# Patient Record
Sex: Male | Born: 1950 | Race: White | Hispanic: No | Marital: Married | Smoking: Never smoker
Health system: Southern US, Community
[De-identification: ages and names within clinical notes are randomized; demographics above are authoritative.]

## PROBLEM LIST (undated history)

## (undated) DIAGNOSIS — I10 Essential (primary) hypertension: Secondary | ICD-10-CM

## (undated) DIAGNOSIS — E039 Hypothyroidism, unspecified: Secondary | ICD-10-CM

## (undated) DIAGNOSIS — E119 Type 2 diabetes mellitus without complications: Secondary | ICD-10-CM

## (undated) HISTORY — PX: OTHER SURGICAL HISTORY: SHX169

## (undated) HISTORY — PX: PENILE PROSTHESIS IMPLANT: SHX240

---

## 2005-04-07 ENCOUNTER — Ambulatory Visit: Payer: Self-pay | Admitting: Cardiology

## 2005-04-10 ENCOUNTER — Ambulatory Visit: Payer: Self-pay | Admitting: Cardiology

## 2005-04-10 ENCOUNTER — Inpatient Hospital Stay (HOSPITAL_BASED_OUTPATIENT_CLINIC_OR_DEPARTMENT_OTHER): Admission: RE | Admit: 2005-04-10 | Discharge: 2005-04-10 | Payer: Self-pay | Admitting: Cardiology

## 2005-04-21 ENCOUNTER — Ambulatory Visit: Payer: Self-pay | Admitting: Cardiology

## 2005-04-21 ENCOUNTER — Ambulatory Visit: Payer: Self-pay

## 2005-04-28 ENCOUNTER — Ambulatory Visit: Payer: Self-pay

## 2005-05-02 ENCOUNTER — Ambulatory Visit: Payer: Self-pay | Admitting: Cardiology

## 2005-06-07 ENCOUNTER — Ambulatory Visit: Payer: Self-pay | Admitting: Cardiology

## 2005-08-27 IMAGING — CT CT ANGIO CHEST
1 of 2 series · 20 of 32 positions shown · IV contrast (APPLIED)
Comparison: none

CLINICAL DATA: Substernal chest pain.  Shortness of breath.  Questionable pulmonary embolus. 
 CT ANGIOGRAPHY OF CHEST (PULMONARY EMBOLUS PROTOCOL):
TECHNIQUE: Multidetector CT imaging of the chest was performed during bolus injection of intravenous contrast.  Multiplanar CT angiographic image reconstructions were generated to evaluate the vascular anatomy.
 Contrast:  125 cc Omnipaque 300, 4 cc/second. 
 There is no CT scan evidence for pulmonary emboli.  There is no evidence for aortic dissection. There is no mediastinal or hilar adenopathy.  There is a small calcified granuloma seen within the right middle lobe.    The chest wall structures as visualized have a normal appearance. There are no infiltrates.  Moderate atelectasis noted within the lung bases.

[Series 9: pe 1.0 b20f st · axial · 0.77mm/px · z∈[-305,-60]mm · 20 of 273 slices shown]
[im 14/273  lung]
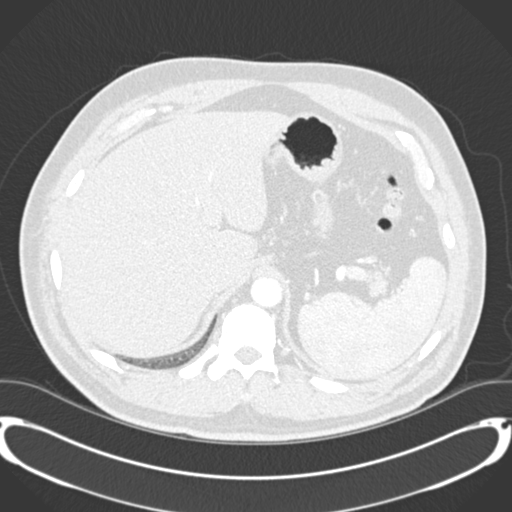
[im 28/273  mediastinal]
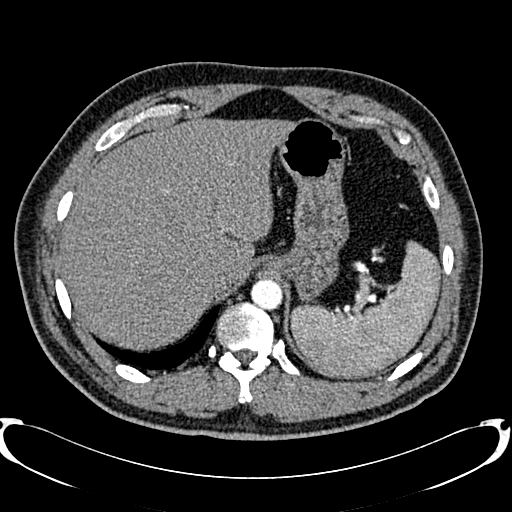
[im 41/273  lung]
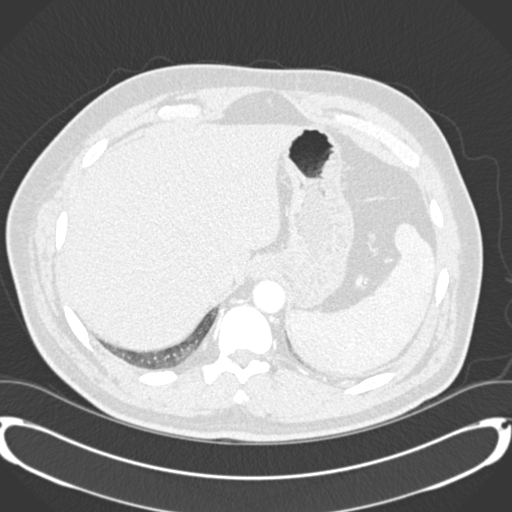
[im 55/273  mediastinal]
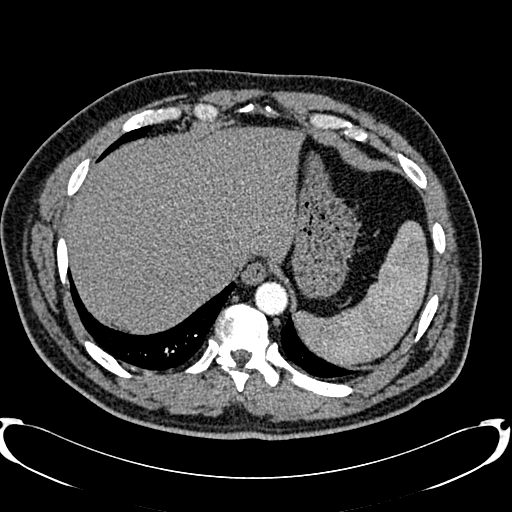
[im 69/273  lung]
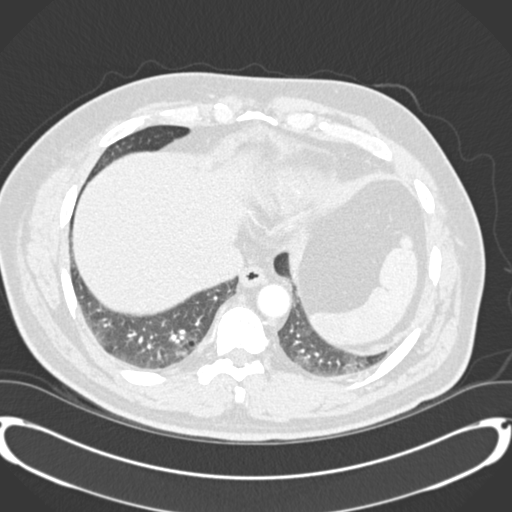
[im 91/273  mediastinal]
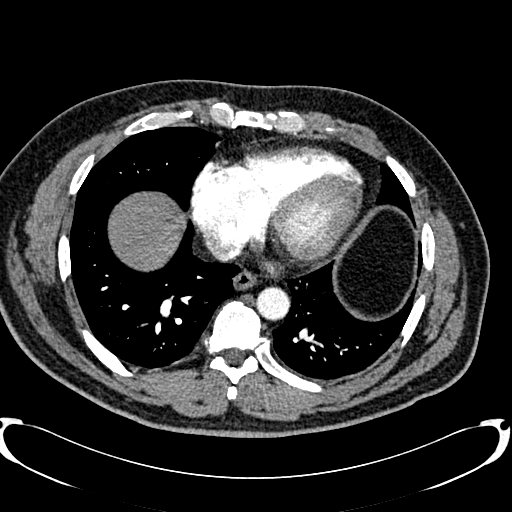
[im 96/273  lung]
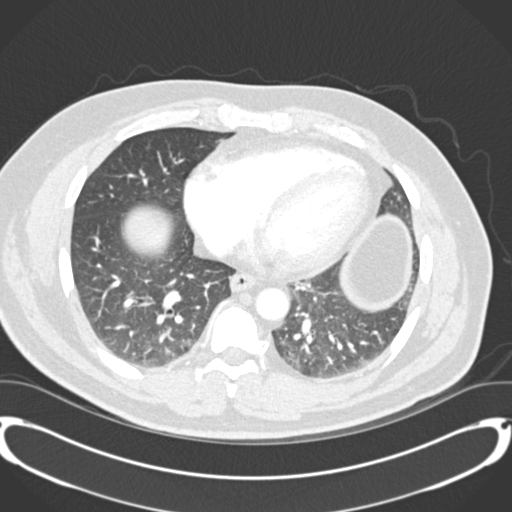
[im 109/273  mediastinal]
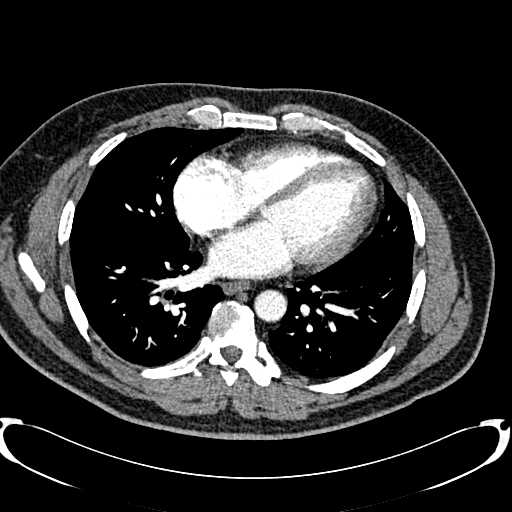
[im 120/273  lung]
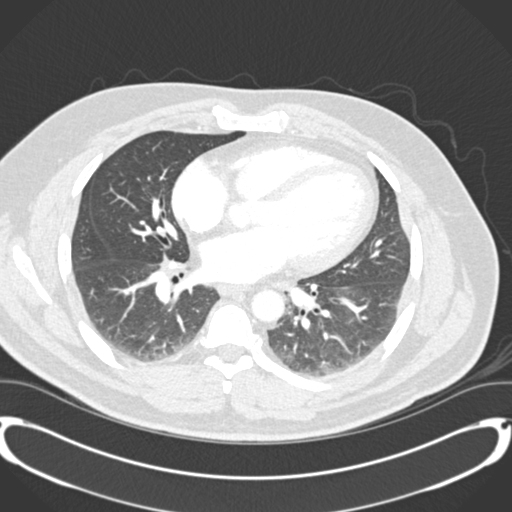
[im 123/273  mediastinal]
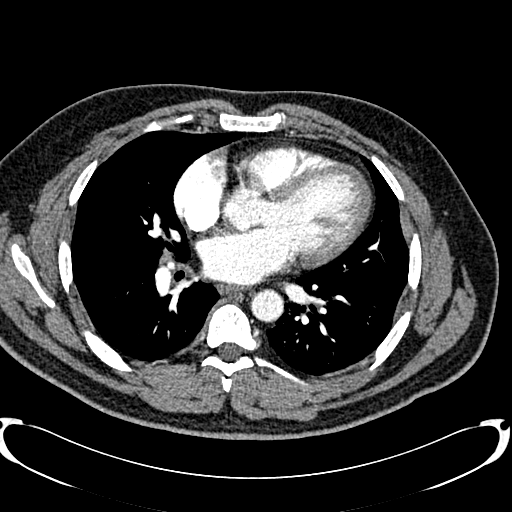
[im 137/273  lung]
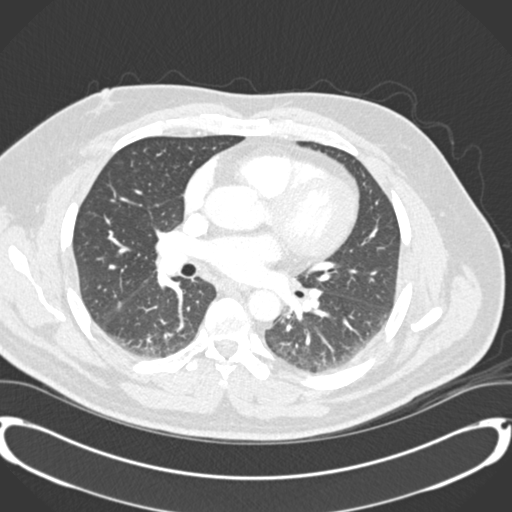
[im 150/273  mediastinal]
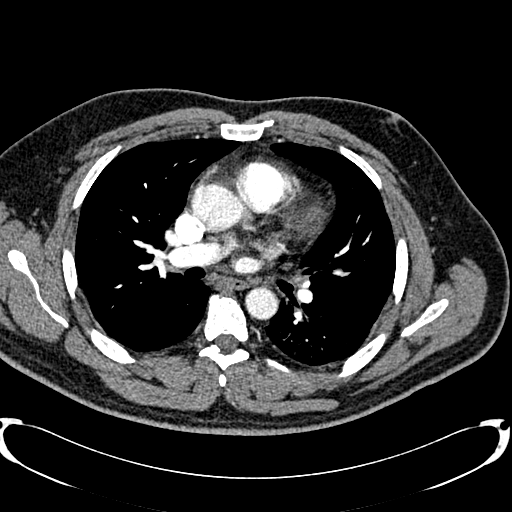
[im 164/273  lung]
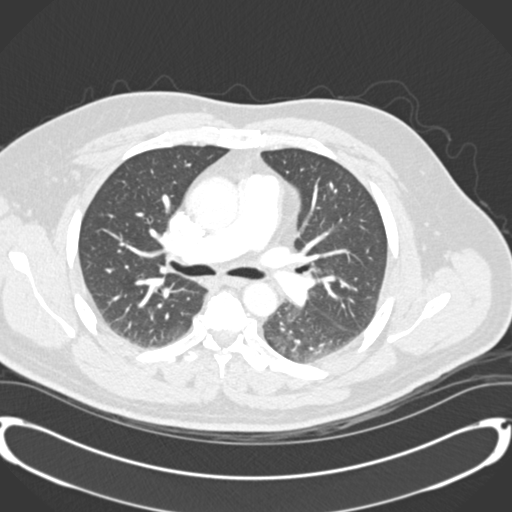
[im 177/273  mediastinal]
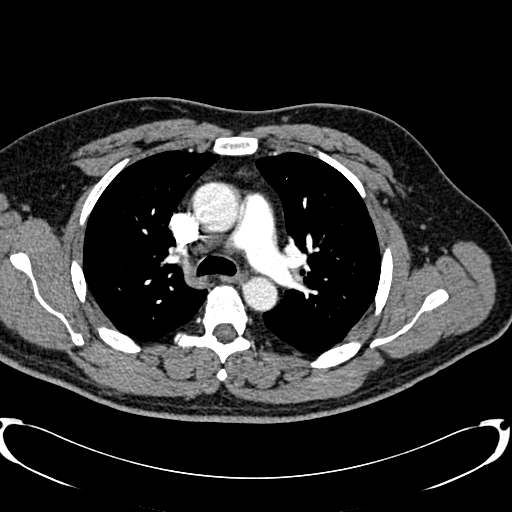
[im 182/273  lung]
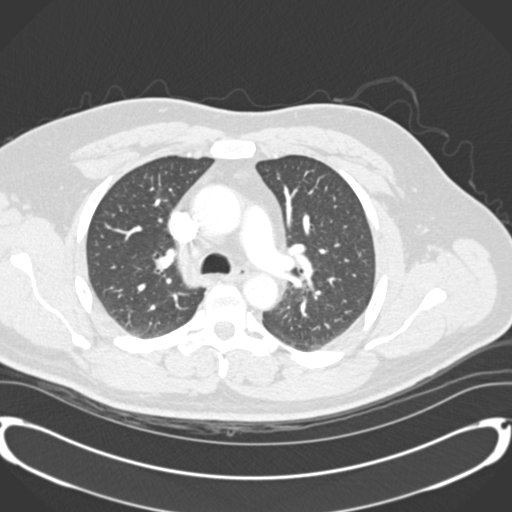
[im 205/273  mediastinal]
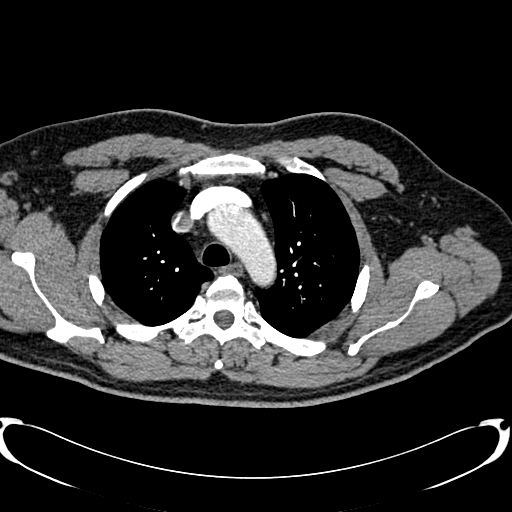
[im 218/273  lung]
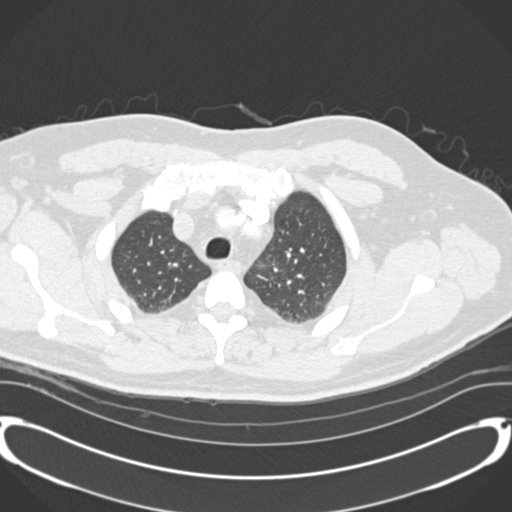
[im 232/273  mediastinal]
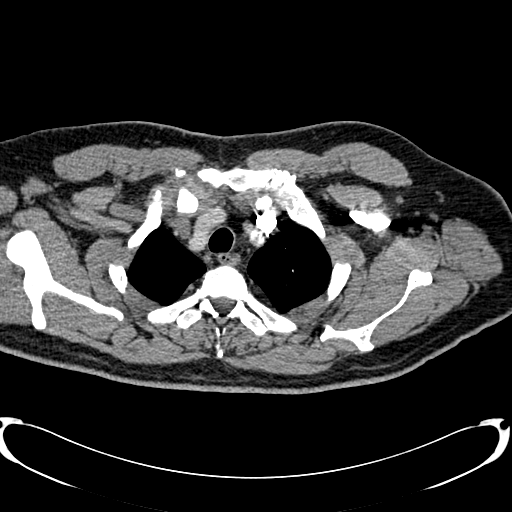
[im 245/273  lung]
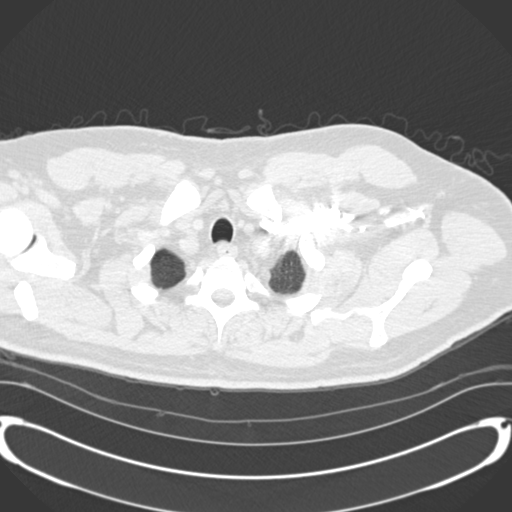
[im 259/273  mediastinal]
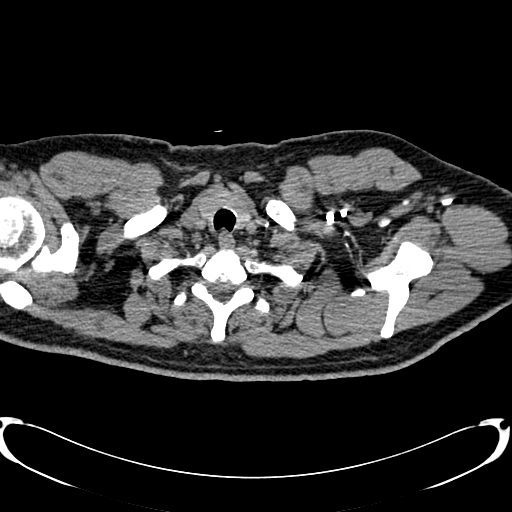

[20 of 32 positions shown; findings below may reference images not displayed]

IMPRESSION: No CT scan evidence for pulmonary emboli or aortic dissection.  Minor bibasilar atelectasis.  Evidence for previous granulomatous disease.

## 2018-07-11 ENCOUNTER — Encounter: Payer: Self-pay | Admitting: Gastroenterology

## 2018-07-12 ENCOUNTER — Encounter: Payer: Self-pay | Admitting: Gastroenterology

## 2020-06-25 ENCOUNTER — Other Ambulatory Visit: Payer: Self-pay | Admitting: Urology

## 2020-07-07 ENCOUNTER — Encounter (HOSPITAL_BASED_OUTPATIENT_CLINIC_OR_DEPARTMENT_OTHER): Payer: Self-pay | Admitting: Urology

## 2020-07-07 ENCOUNTER — Other Ambulatory Visit: Payer: Self-pay

## 2020-07-07 NOTE — Progress Notes (Addendum)
Needs cbc, bmet  ekg day of surgery

## 2020-07-07 NOTE — Progress Notes (Signed)
Spoke w/ via phone for pre-op interview--- Lab needs dos----               Lab results------ COVID test 07/08/20  Arrive at 0845AM NPO after MN NO Solid Food.  Clear liquids from MN [phone] Medications to take morning of surgery SYNTHROID  Diabetic medication NONE DAY OF AND NO SYNJARDY DAY BEFORE  Patient Special Instructions HIBICLENS SHOWER X 2  Pre-Op special Istructions ----- Patient verbalized understanding of instructions that were given at this phone interview. Patient denies shortness of breath, chest pain, fever, cough at this phone interview.

## 2020-07-08 ENCOUNTER — Other Ambulatory Visit (HOSPITAL_COMMUNITY)
Admission: RE | Admit: 2020-07-08 | Discharge: 2020-07-08 | Disposition: A | Discharge status: Home / Self Care | Payer: Medicare Other | Source: Ambulatory Visit | Attending: Urology | Admitting: Urology

## 2020-07-08 ENCOUNTER — Encounter (HOSPITAL_COMMUNITY)
Admission: RE | Admit: 2020-07-08 | Discharge: 2020-07-08 | Disposition: A | Discharge status: Home / Self Care | Payer: Medicare Other | Source: Ambulatory Visit | Attending: Urology | Admitting: Urology

## 2020-07-08 ENCOUNTER — Other Ambulatory Visit (HOSPITAL_COMMUNITY): Payer: Medicare Other

## 2020-07-08 DIAGNOSIS — Z20822 Contact with and (suspected) exposure to covid-19: Secondary | ICD-10-CM | POA: Insufficient documentation

## 2020-07-08 DIAGNOSIS — Z01818 Encounter for other preprocedural examination: Secondary | ICD-10-CM | POA: Insufficient documentation

## 2020-07-08 DIAGNOSIS — E119 Type 2 diabetes mellitus without complications: Secondary | ICD-10-CM | POA: Diagnosis not present

## 2020-07-08 LAB — BASIC METABOLIC PANEL
Anion gap: 10 (ref 5–15)
BUN: 15 mg/dL (ref 8–23)
CO2: 25 mmol/L (ref 22–32)
Calcium: 9.3 mg/dL (ref 8.9–10.3)
Chloride: 103 mmol/L (ref 98–111)
Creatinine, Ser: 0.87 mg/dL (ref 0.61–1.24)
GFR calc Af Amer: >60 mL/min (ref >60)
GFR calc non Af Amer: >60 mL/min (ref >60)
Glucose, Bld: 153 mg/dL — ABNORMAL HIGH (ref 70–99)
Potassium: 4.4 mmol/L (ref 3.5–5.1)
Sodium: 138 mmol/L (ref 135–145)

## 2020-07-08 LAB — CBC
HCT: 41.5 % (ref 39.0–52.0)
Hemoglobin: 13.5 g/dL (ref 13.0–17.0)
MCH: 29.9 pg (ref 26.0–34.0)
MCHC: 32.5 g/dL (ref 30.0–36.0)
MCV: 91.8 fL (ref 80.0–100.0)
Platelets: 226 10*3/uL (ref 150–400)
RBC: 4.52 MIL/uL (ref 4.22–5.81)
RDW: 13.3 % (ref 11.5–15.5)
WBC: 5.9 10*3/uL (ref 4.0–10.5)
nRBC: 0 % (ref 0.0–0.2)

## 2020-07-08 LAB — SARS CORONAVIRUS 2 (TAT 6-24 HRS): SARS Coronavirus 2: NEGATIVE

## 2020-07-11 ENCOUNTER — Encounter (HOSPITAL_BASED_OUTPATIENT_CLINIC_OR_DEPARTMENT_OTHER): Payer: Self-pay | Admitting: Anesthesiology

## 2020-07-11 NOTE — Anesthesia Preprocedure Evaluation (Deleted)
Anesthesia Evaluation    Reviewed: Allergy & Precautions, Patient's Chart, lab work & pertinent test results  Airway        Dental   Pulmonary neg pulmonary ROS,           Cardiovascular Exercise Tolerance: Good negative cardio ROS       Neuro/Psych negative neurological ROS  negative psych ROS   GI/Hepatic negative GI ROS, Neg liver ROS,   Endo/Other  diabetes, Type 2, Insulin DependentHypothyroidism   Renal/GU negative Renal ROSK+ 4.4 Cr 0.87     Musculoskeletal negative musculoskeletal ROS (+)   Abdominal (+) + obese,   Peds  Hematology hgb 13.5   Anesthesia Other Findings   Reproductive/Obstetrics                             Anesthesia Physical Anesthesia Plan  ASA: II  Anesthesia Plan: General   Post-op Pain Management:    Induction: Intravenous  PONV Risk Score and Plan: 3 and Treatment may vary due to age or medical condition, Ondansetron, Dexamethasone and Midazolam  Airway Management Planned: Oral ETT  Additional Equipment: None  Intra-op Plan:   Post-operative Plan:   Informed Consent:     Dental advisory given  Plan Discussed with: CRNA and Anesthesiologist  Anesthesia Plan Comments:         Anesthesia Quick Evaluation

## 2020-07-12 ENCOUNTER — Ambulatory Visit (HOSPITAL_BASED_OUTPATIENT_CLINIC_OR_DEPARTMENT_OTHER)
Admission: RE | Admit: 2020-07-12 | Discharge: 2020-07-12 | Disposition: A | Discharge status: Home / Self Care | Payer: Medicare Other | Attending: Urology | Admitting: Urology

## 2020-07-12 ENCOUNTER — Other Ambulatory Visit: Payer: Self-pay

## 2020-07-12 ENCOUNTER — Encounter (HOSPITAL_BASED_OUTPATIENT_CLINIC_OR_DEPARTMENT_OTHER): Payer: Self-pay | Admitting: Urology

## 2020-07-12 ENCOUNTER — Encounter (HOSPITAL_BASED_OUTPATIENT_CLINIC_OR_DEPARTMENT_OTHER)
Admission: RE | Disposition: A | Discharge status: Home / Self Care | Payer: Self-pay | Source: Home / Self Care | Attending: Urology

## 2020-07-12 DIAGNOSIS — T83490A Other mechanical complication of penile (implanted) prosthesis, initial encounter: Secondary | ICD-10-CM | POA: Insufficient documentation

## 2020-07-12 DIAGNOSIS — Z539 Procedure and treatment not carried out, unspecified reason: Secondary | ICD-10-CM | POA: Diagnosis not present

## 2020-07-12 DIAGNOSIS — Y831 Surgical operation with implant of artificial internal device as the cause of abnormal reaction of the patient, or of later complication, without mention of misadventure at the time of the procedure: Secondary | ICD-10-CM | POA: Insufficient documentation

## 2020-07-12 HISTORY — DX: Hypothyroidism, unspecified: E03.9

## 2020-07-12 HISTORY — DX: Type 2 diabetes mellitus without complications: E11.9

## 2020-07-12 SURGERY — REMOVAL, PENILE PROSTHESIS
Anesthesia: General

## 2020-07-12 MED ORDER — CEFAZOLIN SODIUM-DEXTROSE 2-4 GM/100ML-% IV SOLN
INTRAVENOUS | Status: AC
  Filled 2020-07-12: qty 100

## 2020-07-12 MED ORDER — MIDAZOLAM HCL 2 MG/2ML IJ SOLN
INTRAMUSCULAR | Status: AC
  Filled 2020-07-12: qty 2

## 2020-07-12 MED ORDER — VANCOMYCIN HCL IN DEXTROSE 1-5 GM/200ML-% IV SOLN: 1000.0000 mg | INTRAVENOUS | Status: DC

## 2020-07-12 MED ORDER — CEFAZOLIN SODIUM-DEXTROSE 2-4 GM/100ML-% IV SOLN: 2.0000 g | INTRAVENOUS | Status: DC

## 2020-07-12 MED ORDER — VANCOMYCIN HCL IN DEXTROSE 1-5 GM/200ML-% IV SOLN
INTRAVENOUS | Status: AC
  Filled 2020-07-12: qty 200

## 2020-07-12 MED ORDER — LACTATED RINGERS IV SOLN: INTRAVENOUS | Status: DC

## 2020-07-12 MED ORDER — DEXAMETHASONE SODIUM PHOSPHATE 10 MG/ML IJ SOLN
INTRAMUSCULAR | Status: AC
  Filled 2020-07-12: qty 1

## 2020-07-12 MED ORDER — PROPOFOL 10 MG/ML IV BOLUS
INTRAVENOUS | Status: AC
  Filled 2020-07-12: qty 20

## 2020-07-12 MED ORDER — LIDOCAINE 2% (20 MG/ML) 5 ML SYRINGE
INTRAMUSCULAR | Status: AC
  Filled 2020-07-12: qty 5

## 2020-07-12 MED ORDER — FENTANYL CITRATE (PF) 250 MCG/5ML IJ SOLN
INTRAMUSCULAR | Status: AC
  Filled 2020-07-12: qty 5

## 2020-07-12 MED ORDER — ONDANSETRON HCL 4 MG/2ML IJ SOLN
INTRAMUSCULAR | Status: AC
  Filled 2020-07-12: qty 2

## 2020-07-12 MED ORDER — GENTAMICIN SULFATE 40 MG/ML IJ SOLN
5.0000 mg/kg | INTRAVENOUS | Status: DC
  Filled 2020-07-12: qty 9

## 2020-07-12 SURGICAL SUPPLY — 49 items
BAG DECANTER FOR FLEXI CONT (MISCELLANEOUS) IMPLANT
BAG URINE DRAIN 2000ML AR STRL (UROLOGICAL SUPPLIES) IMPLANT
BLADE HEX COATED 2.75 (ELECTRODE) IMPLANT
BLADE SURG 15 STRL LF DISP TIS (BLADE) IMPLANT
BLADE SURG 15 STRL SS (BLADE)
BNDG COHESIVE 2X5 TAN STRL LF (GAUZE/BANDAGES/DRESSINGS) IMPLANT
BNDG GAUZE ELAST 4 BULKY (GAUZE/BANDAGES/DRESSINGS) IMPLANT
CATH FOLEY 2WAY SLVR  5CC 16FR (CATHETERS)
CATH FOLEY 2WAY SLVR 5CC 16FR (CATHETERS) IMPLANT
CHLORAPREP W/TINT 26 (MISCELLANEOUS) IMPLANT
COVER BACK TABLE 60X90IN (DRAPES) IMPLANT
COVER MAYO STAND STRL (DRAPES) IMPLANT
COVER WAND RF STERILE (DRAPES) IMPLANT
DERMABOND ADVANCED (GAUZE/BANDAGES/DRESSINGS)
DERMABOND ADVANCED .7 DNX12 (GAUZE/BANDAGES/DRESSINGS) IMPLANT
DRAIN CHANNEL 10F 3/8 F FF (DRAIN) IMPLANT
DRAPE INCISE IOBAN 66X45 STRL (DRAPES) IMPLANT
DRAPE LAPAROTOMY 100X72 PEDS (DRAPES) IMPLANT
EVACUATOR DRAINAGE 7X20 100CC (MISCELLANEOUS) IMPLANT
EVACUATOR SILICONE 100CC (DRAIN) IMPLANT
EVACUATOR SILICONE 100CC (MISCELLANEOUS)
GAUZE SPONGE 4X4 12PLY STRL LF (GAUZE/BANDAGES/DRESSINGS) IMPLANT
GLOVE BIO SURGEON STRL SZ7.5 (GLOVE) IMPLANT
GOWN STRL REUS W/TWL XL LVL3 (GOWN DISPOSABLE) IMPLANT
HOLDER FOLEY CATH W/STRAP (MISCELLANEOUS) IMPLANT
HOOD PEEL AWAY FLYTE STAYCOOL (MISCELLANEOUS) IMPLANT
NEEDLE HYPO 22GX1.5 SAFETY (NEEDLE) IMPLANT
NS IRRIG 1000ML POUR BTL (IV SOLUTION) IMPLANT
PACK BASIN DAY SURGERY FS (CUSTOM PROCEDURE TRAY) IMPLANT
PENCIL SMOKE EVACUATOR (MISCELLANEOUS) IMPLANT
PLUG CATH AND CAP STER (CATHETERS) IMPLANT
RETRACTOR WILSON SYSTEM (INSTRUMENTS) IMPLANT
SUPPORT SCROTAL LG STRP (MISCELLANEOUS) IMPLANT
SUPPORTER ATHLETIC LG (MISCELLANEOUS)
SUT MNCRL AB 3-0 PS2 18 (SUTURE) IMPLANT
SUT VIC AB 2-0 SH 27 (SUTURE)
SUT VIC AB 2-0 SH 27XBRD (SUTURE) IMPLANT
SUT VIC AB 2-0 UR6 27 (SUTURE) IMPLANT
SUT VIC AB 4-0 PS2 18 (SUTURE) IMPLANT
SYR 10ML LL (SYRINGE) IMPLANT
SYR 50ML LL SCALE MARK (SYRINGE) IMPLANT
SYR BULB IRRIG 60ML STRL (SYRINGE) IMPLANT
SYR CONTROL 10ML LL (SYRINGE) IMPLANT
TOWEL OR 17X26 10 PK STRL BLUE (TOWEL DISPOSABLE) IMPLANT
TRAY DSU PREP LF (CUSTOM PROCEDURE TRAY) IMPLANT
TUBE CONNECTING 12'X1/4 (SUCTIONS)
TUBE CONNECTING 12X1/4 (SUCTIONS) IMPLANT
WATER STERILE IRR 1000ML POUR (IV SOLUTION) IMPLANT
YANKAUER SUCT BULB TIP NO VENT (SUCTIONS) IMPLANT

## 2020-07-20 ENCOUNTER — Encounter: Payer: Self-pay | Admitting: Neurology

## 2020-07-27 ENCOUNTER — Other Ambulatory Visit: Payer: Self-pay | Admitting: Urology

## 2020-08-09 ENCOUNTER — Encounter (HOSPITAL_BASED_OUTPATIENT_CLINIC_OR_DEPARTMENT_OTHER): Payer: Self-pay

## 2020-08-09 ENCOUNTER — Ambulatory Visit (HOSPITAL_BASED_OUTPATIENT_CLINIC_OR_DEPARTMENT_OTHER): Admit: 2020-08-09 | Payer: Medicare Other | Admitting: Urology

## 2020-08-09 SURGERY — REMOVAL, PENILE PROSTHESIS
Anesthesia: General

## 2020-09-13 ENCOUNTER — Ambulatory Visit (HOSPITAL_BASED_OUTPATIENT_CLINIC_OR_DEPARTMENT_OTHER): Admit: 2020-09-13 | Payer: Medicare Other | Admitting: Urology

## 2020-09-13 ENCOUNTER — Encounter (HOSPITAL_BASED_OUTPATIENT_CLINIC_OR_DEPARTMENT_OTHER): Payer: Self-pay

## 2020-09-13 SURGERY — REMOVAL, PENILE PROSTHESIS
Anesthesia: General

## 2020-10-20 NOTE — Progress Notes (Unsigned)
[facility] Clinic Note - Initial Visit   Date: 10/21/20  Adam Schwartz MRN: [id number] DOB: 01-11-1951   Dear Dr. Karna Christmas:  Thank you for your kind referral of Adam Schwartz for consultation of bilateral hand numbness. Although his history is well known to you, please allow [place] to reiterate it for the purpose of our medical record. The patient was accompanied to the clinic by self.    History of Present Illness: Adam Schwartz is a 70 y.o. right-handed male with diabetes mellitus (HbA1c 7.3), hypertension, hyperlipidemia, and hypothyroidism presenting for evaluation of bilateral hand numbness.   Starting around the fall of 2021, he began having tingling and numbness over the fingertips.  Symptoms are constant and worse in the morning.  He has some difficulty opening jars or bottles with the right hand, but attributes this to difficulty making a tight fist because his right middle finger does not flex completely.  He has episodes where he was unable to completely extend the middle finger and manually having to pry it open. No neck pain. He denies numbness/tingling in the feet.    His diabetes is well-controlled. No history of heavy alcohol use. He owns a bar.   Past Medical History:  Diagnosis Date  . Diabetes mellitus without complication (HCC)    TYPE 2   . Hypothyroidism     Past Surgical History:  Procedure Laterality Date  . MOLE REMOVED     . PENILE PROSTHESIS IMPLANT    . RIGHT BROKEN ANKLE SURGERY        Medications:  Outpatient Encounter Medications as of 10/21/2020  Medication Sig  . acyclovir (ZOVIRAX) 400 MG tablet Take 400 mg by mouth daily. 1/2 TABLET DAILY  . Ascorbic Acid (VITAMIN C) 1000 MG tablet Take 1,000 mg by mouth daily.  . Cholecalciferol (VITAMIN D3) 75 MCG (3000 UT) TABS Take by mouth. PT TAKES 50MCG-200iU - PT TAKES ONCE DAILY  . Cyanocobalamin (VITAMIN B 12 PO) Take by mouth daily. - ONCE DAILY  . diclofenac Sodium  (VOLTAREN) 1 % GEL Apply topically.  . docusate sodium (COLACE) 50 MG capsule Take 50 mg by mouth daily.  . Empagliflozin-metFORMIN HCl (SYNJARDY) 12.02-999 MG TABS Take by mouth daily. PT TAKES 2 TABLETS IN THE AM  . levothyroxine (SYNTHROID) 50 MCG tablet Take 50 mcg by mouth daily before breakfast.  . lisinopril (ZESTRIL) 5 MG tablet Take 5 mg by mouth daily.  . meloxicam (MOBIC) 15 MG tablet Take 15 mg by mouth daily.  . pioglitazone (ACTOS) 45 MG tablet Take 45 mg by mouth daily.  . Semaglutide (OZEMPIC, 1 MG/DOSE, [place]) Inject into the skin once a week. TAKES ON tUESDAYS  . Sennosides (HCA LAX-X PO) Take by mouth. AS NEEDED  . simvastatin (ZOCOR) 40 MG tablet Take 40 mg by mouth daily.  Marland Kitchen tiZANidine (ZANAFLEX) 4 MG capsule Take 4 mg by mouth as needed for muscle spasms.  . diphenhydrAMINE (BENADRYL) 25 MG tablet Take 25 mg by mouth every 6 (six) hours as needed. AS NEEDED FOR ALLERGIES (Patient not taking: Reported on 10/21/2020)  . diphenhydrAMINE (BENADRYL) 50 MG tablet Take 50 mg by mouth at bedtime as needed for itching. (Patient not taking: Reported on 10/21/2020)   No facility-administered encounter medications on file as of 10/21/2020.    Allergies: No Known Allergies  Family History: Family History  Problem Relation Age of Onset  . Anxiety disorder Mother   . Heart attack Father 76  . Lung cancer Brother  Social History: Social History   Tobacco Use  . Smoking status: Never Smoker  . Smokeless tobacco: Never Used  Vaping Use  . Vaping Use: Never used  Substance Use Topics  . Alcohol use: Yes    Comment: ONCE PER WEEK- 2-3 BEERS   . Drug use: Never   Social History   Social History Narrative   Right handed   Drinks caffeine   One story home    Vital Signs:  BP (!) 145/71   Pulse 74   Resp 18   Ht 5\' 6"  (1.676 m)   Wt 191 lb (86.6 kg)   SpO2 98%   BMI 30.83 kg/m    Neurological Exam: MENTAL STATUS including orientation to time, place, person,  recent and remote memory, attention span and concentration, language, and fund of knowledge is normal.  Speech is not dysarthric.  CRANIAL NERVES: II:  No visual field defects.  III-IV-VI: Pupils equal round and reactive to light.  Normal conjugate, extra-ocular eye movements in all directions of gaze.  No nystagmus.  No ptosis.   V:  Normal facial sensation.    VII:  Normal facial symmetry and movements.   VIII:  Normal hearing and vestibular function.   IX-X:  Normal palatal movement.   XI:  Normal shoulder shrug and head rotation.   XII:  Normal tongue strength and range of motion, no deviation or fasciculation.  MOTOR:  Incomplete right middle finger flexion at the PIP and MCP.  No atrophy, fasciculations or abnormal movements.  No pronator drift.   Upper Extremity:  Right  Left  Deltoid  5/5   5/5   Biceps  5/5   5/5   Triceps  5/5   5/5   Infraspinatus 5/5  5/5  Medial pectoralis 5/5  5/5  Wrist extensors  5/5   5/5   Wrist flexors  5/5   5/5   Finger extensors  5/5   5/5   Finger flexors  5/5   5/5   Dorsal interossei  5-/5   5/5   Abductor pollicis  5-/5   5-/5   Tone (Ashworth scale)  0  0   Lower Extremity:  Right  Left  Hip flexors  5/5   5/5   Hip extensors  5/5   5/5   Adductor 5/5  5/5  Abductor 5/5  5/5  Knee flexors  5/5   5/5   Knee extensors  5/5   5/5   Dorsiflexors  5/5   5/5   Plantarflexors  5/5   5/5   Toe extensors  5/5   5/5   Toe flexors  5/5   5/5   Tone (Ashworth scale)  0  0   MSRs:  Right        Left                  brachioradialis 2+  2+  biceps 2+  2+  triceps 2+  2+  patellar 2+  2+  ankle jerk 2+  2+  Hoffman no  no  plantar response down  down   SENSORY:  Normal and symmetric perception of light touch, pinprick, vibration, and proprioception.   COORDINATION/GAIT: Normal finger-to- nose-finger and heel-to-shin.  Intact rapid alternating movements bilaterally.  Gait narrow based and stable. Tandem and stressed gait intact.     IMPRESSION: 1. Bilateral hand paresthesias, suggestive of entrapment neuropathy such as carpal tunnel syndrome.  Lack of radicular symptoms makes cervical radiculopathy less  likely.  - NCS/EMG bilateral upper extremities  - Start wearing a wrist splint at night time  2. Right middle trigger finger  - Referral to hand orthopaedics   Further recommendations pending results.   Thank you for allowing me to participate in patient's care.  If I can answer any additional questions, I would be pleased to do so.    Sincerely,    Isrrael Fluckiger K. Posey Pronto, DO

## 2020-10-21 ENCOUNTER — Ambulatory Visit: Payer: Medicare Other | Admitting: Neurology

## 2020-10-21 ENCOUNTER — Other Ambulatory Visit: Payer: Self-pay

## 2020-10-21 ENCOUNTER — Encounter: Payer: Self-pay | Admitting: Neurology

## 2020-10-21 VITALS — BP 145/71 | HR 74 | Resp 18 | Ht 66.0 in | Wt 191.0 lb

## 2020-10-21 DIAGNOSIS — R202 Paresthesia of skin: Secondary | ICD-10-CM | POA: Diagnosis not present

## 2020-10-21 DIAGNOSIS — M65331 Trigger finger, right middle finger: Secondary | ICD-10-CM

## 2020-10-21 NOTE — Patient Instructions (Signed)
Nerve testing of the hands  Start wearing a wrist brace at night time  We will refer you to hand specialists for your right trigger finger

## 2020-12-01 ENCOUNTER — Encounter: Payer: Medicare Other | Admitting: Neurology

## 2020-12-08 ENCOUNTER — Other Ambulatory Visit: Payer: Self-pay

## 2020-12-08 ENCOUNTER — Ambulatory Visit: Payer: Medicare Other | Admitting: Neurology

## 2020-12-08 DIAGNOSIS — G5603 Carpal tunnel syndrome, bilateral upper limbs: Secondary | ICD-10-CM

## 2020-12-08 DIAGNOSIS — M5412 Radiculopathy, cervical region: Secondary | ICD-10-CM

## 2020-12-08 DIAGNOSIS — M65331 Trigger finger, right middle finger: Secondary | ICD-10-CM

## 2020-12-08 NOTE — Procedures (Signed)
[facility]  [address], [address]  [place], [place] [address] Tel: [phone] Fax:  [phone] Test Date:  12/08/2020  Patient: Adam Schwartz DOB: November 26, 1950 Physician: Narda Amber, DO  Sex: Male Height: 5\' 6"  Ref Phys: Narda Amber, DO  ID#: [id number]   Technician:    Patient Complaints: This is a 70 year old man referred for evaluation of bilateral hand numbness and tingling.  NCV & EMG Findings: Extensive electrodiagnostic testing of the right upper extremity and additional studies of the left shows:  1. Right median sensory response shows prolonged latency (4.3 ms).  Left mixed palmar sensory responses show prolonged latency.  Left median and bilateral ulnar sensory responses are within normal limits.   2. Bilateral median and ulnar motor responses are within normal limits.   3. Chronic motor axonal loss changes are seen affecting the C7 myotomes bilaterally, without accompanied active denervation.    Impression: 1. Bilateral median neuropathy at or distal to the wrist, consistent with a clinical diagnosis of carpal tunnel syndrome.  Overall, these findings are mild in degree electrically and worse on the right.   2. 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     ___________________________ Narda Amber, DO    Nerve Conduction Studies Anti Sensory Summary Table   Stim Site NR Peak (ms) Norm Peak (ms) P-T Amp (V) Norm P-T Amp  Left Median Anti Sensory (2nd Digit)  33C  Wrist    3.6 <3.8 23.2 >10  Right Median Anti Sensory (2nd Digit)  33C  Wrist    4.3 <3.8 23.3 >10  Left Ulnar Anti Sensory (5th Digit)  33C  Wrist    3.1 <3.2 15.2 >5  Right Ulnar Anti Sensory (5th Digit)  33C  Wrist    2.8 <3.2 14.5 >5   Motor Summary Table   Stim Site NR Onset (ms) Norm Onset (ms) O-P Amp (mV) Norm O-P Amp Site1 Site2 Delta-0 (ms) Dist (cm) Vel (m/s) Norm Vel (m/s)  Left Median Motor (Abd Poll Brev)  33C  Wrist    3.7 <4.0  7.6 >5 Elbow Wrist 5.5 28.0 51 >50  Elbow    9.2  7.2         Right Median Motor (Abd Poll Brev)  33C  Wrist    3.8 <4.0 9.5 >5 Elbow Wrist 5.3 29.0 55 >50  Elbow    9.1  8.8         Left Ulnar Motor (Abd Dig Minimi)  33C  Wrist    2.8 <3.1 7.7 >7 B Elbow Wrist 3.9 22.0 56 >50  B Elbow    6.7  7.5  A Elbow B Elbow 2.0 10.0 50 >50  A Elbow    8.7  7.3         Right Ulnar Motor (Abd Dig Minimi)  33C  Wrist    3.1 <3.1 9.2 >7 B Elbow Wrist 3.9 22.0 56 >50  B Elbow    7.0  8.7  A Elbow B Elbow 1.7 10.0 59 >50  A Elbow    8.7  8.6          Comparison Summary Table   Stim Site NR Peak (ms) Norm Peak (ms) P-T Amp (V) Site1 Site2 Delta-P (ms) Norm Delta (ms)  Left Median/Ulnar Palm Comparison (Wrist - 8cm)  33C  Median Palm    2.3 <2.2 29.0 Median Palm Ulnar Palm 0.4   Ulnar Palm    1.9 <2.2 8.7  EMG   Side Muscle Ins Act Fibs Psw Fasc Number Recrt Dur Dur. Amp Amp. Poly Poly. Comment  Right 1stDorInt Nml Nml Nml Nml Nml Nml Nml Nml Nml Nml Nml Nml N/A  Right Triceps Nml Nml Nml Nml 2- Rapid Many 1+ Many 1+ Many 1+ N/A  Right Deltoid Nml Nml Nml Nml Nml Nml Nml Nml Nml Nml Nml Nml N/A  Right Abd Poll Brev Nml Nml Nml Nml Nml Nml Nml Nml Nml Nml Nml Nml N/A  Right PronatorTeres Nml Nml Nml Nml 2- Rapid Many 1+ Many 1+ Many 1+ N/A  Right Biceps Nml Nml Nml Nml Nml Nml Nml Nml Nml Nml Nml Nml N/A  Left 1stDorInt Nml Nml Nml Nml Nml Nml Nml Nml Nml Nml Nml Nml N/A  Left PronatorTeres Nml Nml Nml Nml 2- Rapid Many 1+ Many 1+ Many 1+ N/A  Left Biceps Nml Nml Nml Nml Nml Nml Nml Nml Nml Nml Nml Nml N/A  Left Triceps Nml Nml Nml Nml 2- Rapid Many 1+ Many 1+ Many 1+ N/A  Left Deltoid Nml Nml Nml Nml Nml Nml Nml Nml Nml Nml Nml Nml N/A      Waveforms:

## 2020-12-09 ENCOUNTER — Telehealth: Payer: Self-pay

## 2020-12-09 NOTE — Telephone Encounter (Signed)
-----   Message from Alda Berthold, DO sent at 12/09/2020 12:35 PM EST ----- Please inform patient that his nerve testing shows mild bilateral carpal tunnel syndrome and that he may have a pinched nerve in the neck.  If he is still having a lot of numbness/tingling, we can refer him to start neck PT and he may wear wrist braces for carpal tunnel syndrome at night.  If symptoms are better, OK to monitor.

## 2020-12-09 NOTE — Telephone Encounter (Signed)
Pt called and informed that nerve testing shows mild bilateral carpal tunnel syndrome and that he may have a pinched nerve in the neck. If he is still having a lot of numbness/tingling, we can refer him to start neck PT and he may wear wrist braces for carpal tunnel syndrome at night. If symptoms are better, OK to monitor. Pt would like to just monitor at this time, he said it wasn't that bad

## 2021-01-04 ENCOUNTER — Encounter: Payer: Self-pay | Admitting: Orthopaedic Surgery

## 2021-01-04 ENCOUNTER — Ambulatory Visit: Payer: Self-pay

## 2021-01-04 ENCOUNTER — Ambulatory Visit: Payer: Medicare Other | Admitting: Orthopaedic Surgery

## 2021-01-04 ENCOUNTER — Other Ambulatory Visit: Payer: Self-pay

## 2021-01-04 VITALS — BP 110/58 | HR 70 | Ht 66.0 in | Wt 181.0 lb

## 2021-01-04 DIAGNOSIS — M4722 Other spondylosis with radiculopathy, cervical region: Secondary | ICD-10-CM

## 2021-01-04 DIAGNOSIS — R200 Anesthesia of skin: Secondary | ICD-10-CM

## 2021-01-04 NOTE — Progress Notes (Signed)
Office Visit Note   Patient: Adam Schwartz           Date of Birth: 03-22-1951           MRN: [id number] Visit Date: 01/04/2021              Requested by: Bonnita Nasuti, MD [address] [place],  [place] [address] PCP: Bonnita Nasuti, MD   Assessment & Plan: Visit Diagnoses:  1. Bilateral hand numbness   2. Other spondylosis with radiculopathy, cervical region     Plan: We reviewed plain radiographs as well as electrical test.  He does have chronic C7 radiculopathy but has not noticed any power not having any significant neck pain.  Of his hand numbness gets worse I would recommend he proceed with carpal tunnel release with Dr. Fredna Dow and then he can return to see me if he has ongoing problems.  We discussed getting a cervical MRI scan.  Currently without evidence of myelopathy and no symptoms of radiculopathy currently this could be deferred.  Follow-Up Instructions: No follow-ups on file.   Orders:  Orders Placed This Encounter  Procedures  . XR Cervical Spine 2 or 3 views   No orders of the defined types were placed in this encounter.     Procedures: No procedures performed   Clinical Data: No additional findings.   Subjective: Chief Complaint  Patient presents with  . Right Hand - Numbness  . 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 Daryll Brod for C7 radiculopathy with positive EMGs and positive nerve conduction velocities for bilateral carpal tunnel syndrome.  Patient has had symptoms for about 6 months no known injury.  Did have some triggering with the right middle finger triggering which got better with an injection.  Electrical test showed chronic C7 radiculopathy bilaterally.  Patient does have type 2 diabetes hypertension.  He has not noticed any decreased grip strength no problems with pushing.  He rides motorcycle has no trouble pushing motorcycle.  Review  of Systems Positive for diabetes hypertension C7 radiculopathy bilateral carpal tunnel syndrome.  Previous traumatic ankle surgery.  All other systems noncontributory to HPI.  Objective: Vital Signs: BP (!) 110/58   Pulse 70   Ht 5\' 6"  (1.676 m)   Wt 181 lb (82.1 kg)   BMI 29.21 kg/m   Physical Exam Constitutional:      Appearance: He is well-developed.  HENT:     Head: Normocephalic and atraumatic.  Eyes:     Pupils: Pupils are equal, round, and reactive to light.  Neck:     Thyroid: No thyromegaly.     Trachea: No tracheal deviation.  Cardiovascular:     Rate and Rhythm: Normal rate.  Pulmonary:     Effort: Pulmonary effort is normal.     Breath sounds: No wheezing.  Abdominal:     General: Bowel sounds are normal.     Palpations: Abdomen is soft.  Skin:    General: Skin is warm and dry.     Capillary Refill: Capillary refill takes less than 2 seconds.  Neurological:     Mental Status: He is alert and oriented to person, place, and time.  Psychiatric:        Behavior: Behavior normal.        Thought Content: Thought content normal.  Judgment: Judgment normal.     Ortho Exam patient has trace triceps right and left 2+ biceps brachioradialis.  Good triceps strength resisted testing no FCU ECU weakness.  Interossei are strong.  No triggering.  Some tingling with the Phalen's test right and left.  Normal heel toe gait no clonus.  Specialty Comments:  No specialty comments available.  Imaging: XR Cervical Spine 2 or 3 views  Result Date: 01/04/2021 AP and lateral cervical spine images are obtained and reviewed.  There is cervical spondylosis with 2 mm anterolisthesis at C4-5 prominent spurring dissipates during C5-6 C6-7 uncovertebral changes more significant at C6-7.  Some calcification of the posterior interspinous ligament. Impression: Cervical spondylosis as described above.    PMFS History: Patient Active Problem List   Diagnosis Date Noted  . Other  spondylosis with radiculopathy, cervical region 01/04/2021   Past Medical History:  Diagnosis Date  . Diabetes mellitus without complication ([facility])    TYPE 2   . Hypothyroidism     Family History  Problem Relation Age of Onset  . Anxiety disorder Mother   . Heart attack Father 1  . Lung cancer Brother     Past Surgical History:  Procedure Laterality Date  . MOLE REMOVED     . PENILE PROSTHESIS IMPLANT    . RIGHT BROKEN ANKLE SURGERY      Social History   Occupational History  . Not on file  Tobacco Use  . Smoking status: Never Smoker  . Smokeless tobacco: Never Used  Vaping Use  . Vaping Use: Never used  Substance and Sexual Activity  . Alcohol use: Yes    Comment: ONCE PER WEEK- 2-3 BEERS   . Drug use: Never  . Sexual activity: Not on file

## 2021-01-27 ENCOUNTER — Telehealth: Payer: Self-pay

## 2021-01-27 NOTE — Telephone Encounter (Signed)
Faxed office note to Dr. Lucie Leather  Office.

## 2021-03-21 ENCOUNTER — Other Ambulatory Visit: Payer: Self-pay | Admitting: Orthopedic Surgery

## 2021-03-28 ENCOUNTER — Encounter (HOSPITAL_COMMUNITY): Payer: Self-pay | Admitting: Orthopedic Surgery

## 2021-03-28 ENCOUNTER — Other Ambulatory Visit: Payer: Self-pay

## 2021-03-28 NOTE — Progress Notes (Signed)
Adam Schwartz denies chest pain or shortness of breath. Patient denies s/s of Covid in his home and no exposure to his knowledge.

## 2021-03-29 ENCOUNTER — Encounter (HOSPITAL_COMMUNITY)
Admission: RE | Disposition: A | Discharge status: Home / Self Care | Payer: Self-pay | Source: Home / Self Care | Attending: Orthopedic Surgery

## 2021-03-29 ENCOUNTER — Encounter (HOSPITAL_COMMUNITY): Payer: Self-pay | Admitting: Orthopedic Surgery

## 2021-03-29 ENCOUNTER — Ambulatory Visit (HOSPITAL_COMMUNITY): Payer: Medicare Other | Admitting: Anesthesiology

## 2021-03-29 ENCOUNTER — Ambulatory Visit (HOSPITAL_COMMUNITY)
Admission: RE | Admit: 2021-03-29 | Discharge: 2021-03-29 | Disposition: A | Discharge status: Home / Self Care | Payer: Medicare Other | Attending: Orthopedic Surgery | Admitting: Orthopedic Surgery

## 2021-03-29 ENCOUNTER — Other Ambulatory Visit: Payer: Self-pay

## 2021-03-29 DIAGNOSIS — E039 Hypothyroidism, unspecified: Secondary | ICD-10-CM | POA: Insufficient documentation

## 2021-03-29 DIAGNOSIS — Z8249 Family history of ischemic heart disease and other diseases of the circulatory system: Secondary | ICD-10-CM | POA: Insufficient documentation

## 2021-03-29 DIAGNOSIS — G5603 Carpal tunnel syndrome, bilateral upper limbs: Secondary | ICD-10-CM | POA: Diagnosis present

## 2021-03-29 DIAGNOSIS — Z880 Allergy status to penicillin: Secondary | ICD-10-CM | POA: Insufficient documentation

## 2021-03-29 DIAGNOSIS — M65331 Trigger finger, right middle finger: Secondary | ICD-10-CM | POA: Insufficient documentation

## 2021-03-29 DIAGNOSIS — M65841 Other synovitis and tenosynovitis, right hand: Secondary | ICD-10-CM | POA: Insufficient documentation

## 2021-03-29 DIAGNOSIS — Z801 Family history of malignant neoplasm of trachea, bronchus and lung: Secondary | ICD-10-CM | POA: Diagnosis not present

## 2021-03-29 DIAGNOSIS — E119 Type 2 diabetes mellitus without complications: Secondary | ICD-10-CM | POA: Insufficient documentation

## 2021-03-29 HISTORY — DX: Essential (primary) hypertension: I10

## 2021-03-29 HISTORY — PX: CARPAL TUNNEL RELEASE: SHX101

## 2021-03-29 HISTORY — PX: TRIGGER FINGER RELEASE: SHX641

## 2021-03-29 LAB — BASIC METABOLIC PANEL
Anion gap: 13 (ref 5–15)
BUN: 17 mg/dL (ref 8–23)
CO2: 24 mmol/L (ref 22–32)
Calcium: 9.6 mg/dL (ref 8.9–10.3)
Chloride: 103 mmol/L (ref 98–111)
Creatinine, Ser: 0.77 mg/dL (ref 0.61–1.24)
GFR, Estimated: >60 mL/min (ref >60)
Glucose, Bld: 118 mg/dL — ABNORMAL HIGH (ref 70–99)
Potassium: 4.2 mmol/L (ref 3.5–5.1)
Sodium: 140 mmol/L (ref 135–145)

## 2021-03-29 LAB — GLUCOSE, CAPILLARY
Glucose-Capillary: 121 mg/dL — ABNORMAL HIGH (ref 70–99)
Glucose-Capillary: 105 mg/dL — ABNORMAL HIGH (ref 70–99)

## 2021-03-29 SURGERY — CARPAL TUNNEL RELEASE
Anesthesia: Regional | Site: Wrist | Laterality: Right

## 2021-03-29 MED ORDER — OXYCODONE HCL 5 MG PO TABS: 5.0000 mg | ORAL_TABLET | Freq: Once | ORAL | Status: DC | PRN

## 2021-03-29 MED ORDER — PROPOFOL 10 MG/ML IV BOLUS
INTRAVENOUS | Status: AC
  Filled 2021-03-29: qty 40

## 2021-03-29 MED ORDER — LIDOCAINE HCL (PF) 0.5 % IJ SOLN
INTRAMUSCULAR | Status: DC | PRN
  Administered 2021-03-29: 30 mL via INTRAVENOUS

## 2021-03-29 MED ORDER — PROPOFOL 500 MG/50ML IV EMUL
INTRAVENOUS | Status: AC
  Filled 2021-03-29: qty 100

## 2021-03-29 MED ORDER — LACTATED RINGERS IV SOLN: INTRAVENOUS | Status: DC

## 2021-03-29 MED ORDER — MIDAZOLAM HCL 2 MG/2ML IJ SOLN
INTRAMUSCULAR | Status: AC
  Filled 2021-03-29: qty 2

## 2021-03-29 MED ORDER — PROMETHAZINE HCL 25 MG/ML IJ SOLN
6.2500 mg | INTRAMUSCULAR | Status: DC | PRN
Start: 2021-03-29 — End: 2021-03-29

## 2021-03-29 MED ORDER — MIDAZOLAM HCL 2 MG/2ML IJ SOLN
INTRAMUSCULAR | Status: DC | PRN
  Administered 2021-03-29: 2 mg via INTRAVENOUS

## 2021-03-29 MED ORDER — CHLORHEXIDINE GLUCONATE 0.12 % MT SOLN: 15.0000 mL | Freq: Once | OROMUCOSAL | Status: AC

## 2021-03-29 MED ORDER — FENTANYL CITRATE (PF) 250 MCG/5ML IJ SOLN
INTRAMUSCULAR | Status: AC
  Filled 2021-03-29: qty 5

## 2021-03-29 MED ORDER — DEXAMETHASONE SODIUM PHOSPHATE 10 MG/ML IJ SOLN
INTRAMUSCULAR | Status: AC
  Filled 2021-03-29: qty 1

## 2021-03-29 MED ORDER — OXYCODONE HCL 5 MG/5ML PO SOLN: 5.0000 mg | Freq: Once | ORAL | Status: DC | PRN

## 2021-03-29 MED ORDER — ORAL CARE MOUTH RINSE: 15.0000 mL | Freq: Once | OROMUCOSAL | Status: AC

## 2021-03-29 MED ORDER — CLINDAMYCIN PHOSPHATE 900 MG/50ML IV SOLN
900.0000 mg | INTRAVENOUS | Status: AC
  Administered 2021-03-29: 900 mg via INTRAVENOUS
  Filled 2021-03-29: qty 50

## 2021-03-29 MED ORDER — PROPOFOL 500 MG/50ML IV EMUL
INTRAVENOUS | Status: DC | PRN
  Administered 2021-03-29: 75 ug/kg/min via INTRAVENOUS

## 2021-03-29 MED ORDER — ONDANSETRON HCL 4 MG/2ML IJ SOLN
INTRAMUSCULAR | Status: AC
  Filled 2021-03-29: qty 2

## 2021-03-29 MED ORDER — ONDANSETRON HCL 4 MG/2ML IJ SOLN
INTRAMUSCULAR | Status: DC | PRN
  Administered 2021-03-29: 4 mg via INTRAVENOUS

## 2021-03-29 MED ORDER — HYDROMORPHONE HCL 1 MG/ML IJ SOLN: 0.2500 mg | INTRAMUSCULAR | Status: DC | PRN

## 2021-03-29 MED ORDER — BUPIVACAINE HCL 0.25 % IJ SOLN
INTRAMUSCULAR | Status: DC | PRN
  Administered 2021-03-29: 10 mL

## 2021-03-29 MED ORDER — LIDOCAINE HCL (PF) 2 % IJ SOLN
INTRAMUSCULAR | Status: AC
  Filled 2021-03-29: qty 5

## 2021-03-29 MED ORDER — CHLORHEXIDINE GLUCONATE 0.12 % MT SOLN
OROMUCOSAL | Status: AC
  Administered 2021-03-29: 15 mL via OROMUCOSAL
  Filled 2021-03-29: qty 15

## 2021-03-29 MED ORDER — FENTANYL CITRATE (PF) 100 MCG/2ML IJ SOLN
INTRAMUSCULAR | Status: DC | PRN
  Administered 2021-03-29: 25 ug via INTRAVENOUS
  Administered 2021-03-29: 50 ug via INTRAVENOUS
  Administered 2021-03-29: 25 ug via INTRAVENOUS

## 2021-03-29 MED ORDER — TRAMADOL HCL 50 MG PO TABS: 50.0000 mg | ORAL_TABLET | Freq: Four times a day (QID) | ORAL | 0 refills | Status: AC | PRN

## 2021-03-29 MED ORDER — BUPIVACAINE HCL (PF) 0.25 % IJ SOLN
INTRAMUSCULAR | Status: AC
  Filled 2021-03-29: qty 30

## 2021-03-29 SURGICAL SUPPLY — 37 items
BNDG COHESIVE 3X5 TAN STRL LF (GAUZE/BANDAGES/DRESSINGS) ×4 IMPLANT
BNDG ESMARK 4X9 LF (GAUZE/BANDAGES/DRESSINGS) ×4 IMPLANT
BNDG GAUZE ELAST 4 BULKY (GAUZE/BANDAGES/DRESSINGS) ×4 IMPLANT
CHLORAPREP W/TINT 26 (MISCELLANEOUS) ×4 IMPLANT
CORD BIPOLAR FORCEPS 12FT (ELECTRODE) ×4 IMPLANT
COVER SURGICAL LIGHT HANDLE (MISCELLANEOUS) ×4 IMPLANT
COVER WAND RF STERILE (DRAPES) IMPLANT
CUFF TOURN SGL QUICK 18X4 (TOURNIQUET CUFF) ×4 IMPLANT
CUFF TOURN SGL QUICK 24 (TOURNIQUET CUFF)
CUFF TRNQT CYL 24X4X16.5-23 (TOURNIQUET CUFF) IMPLANT
DECANTER SPIKE VIAL GLASS SM (MISCELLANEOUS) IMPLANT
DRAPE EXTREMITY T 121X128X90 (DISPOSABLE) ×4 IMPLANT
DRAPE SURG 17X23 STRL (DRAPES) ×4 IMPLANT
GAUZE SPONGE 4X4 12PLY STRL (GAUZE/BANDAGES/DRESSINGS) IMPLANT
GAUZE XEROFORM 1X8 LF (GAUZE/BANDAGES/DRESSINGS) ×4 IMPLANT
GLOVE SRG 8 PF TXTR STRL LF DI (GLOVE) IMPLANT
GLOVE SURG ORTHO 8.0 STRL STRW (GLOVE) ×4 IMPLANT
GLOVE SURG POLYISO LF SZ7 (GLOVE) ×4 IMPLANT
GLOVE SURG UNDER POLY LF SZ8 (GLOVE)
GLOVE SURG UNDER POLY LF SZ8.5 (GLOVE) ×4 IMPLANT
GOWN STRL REUS W/ TWL LRG LVL3 (GOWN DISPOSABLE) ×2 IMPLANT
GOWN STRL REUS W/ TWL XL LVL3 (GOWN DISPOSABLE) ×2 IMPLANT
GOWN STRL REUS W/TWL LRG LVL3 (GOWN DISPOSABLE) ×4
GOWN STRL REUS W/TWL XL LVL3 (GOWN DISPOSABLE) ×4
KIT BASIN OR (CUSTOM PROCEDURE TRAY) ×4 IMPLANT
KIT TURNOVER KIT B (KITS) ×4 IMPLANT
NEEDLE HYPO 25GX1X1/2 BEV (NEEDLE) ×4 IMPLANT
NS IRRIG 1000ML POUR BTL (IV SOLUTION) ×4 IMPLANT
PACK ORTHO EXTREMITY (CUSTOM PROCEDURE TRAY) ×4 IMPLANT
PAD ARMBOARD 7.5X6 YLW CONV (MISCELLANEOUS) ×4 IMPLANT
PAD CAST 4YDX4 CTTN HI CHSV (CAST SUPPLIES) IMPLANT
PADDING CAST COTTON 4X4 STRL (CAST SUPPLIES)
STOCKINETTE 4X48 STRL (DRAPES) ×4 IMPLANT
SUT ETHILON 4 0 PS 2 18 (SUTURE) ×4 IMPLANT
SYR CONTROL 10ML LL (SYRINGE) ×4 IMPLANT
TOWEL GREEN STERILE (TOWEL DISPOSABLE) ×4 IMPLANT
UNDERPAD 30X36 HEAVY ABSORB (UNDERPADS AND DIAPERS) ×4 IMPLANT

## 2021-03-29 NOTE — Anesthesia Preprocedure Evaluation (Signed)
Anesthesia Evaluation  Patient identified by MRN, date of birth, ID band Patient awake    Reviewed: Allergy & Precautions, NPO status , Patient's Chart, lab work & pertinent test results  Airway Mallampati: II  TM Distance: >3 FB Neck ROM: Full    Dental no notable dental hx.    Pulmonary neg pulmonary ROS,    Pulmonary exam normal breath sounds clear to auscultation       Cardiovascular hypertension, Pt. on medications negative cardio ROS Normal cardiovascular exam Rhythm:Regular Rate:Normal     Neuro/Psych negative neurological ROS  negative psych ROS   GI/Hepatic negative GI ROS, Neg liver ROS,   Endo/Other  diabetes, Type 2Hypothyroidism   Renal/GU negative Renal ROS  negative genitourinary   Musculoskeletal  (+) Arthritis , Osteoarthritis,    Abdominal   Peds negative pediatric ROS (+)  Hematology negative hematology ROS (+)   Anesthesia Other Findings   Reproductive/Obstetrics negative OB ROS                             Anesthesia Physical Anesthesia Plan  ASA: 3  Anesthesia Plan: Bier Block and Bier Block-LIDOCAINE ONLY   Post-op Pain Management:    Induction: Intravenous  PONV Risk Score and Plan: 1 and Ondansetron and Treatment may vary due to age or medical condition  Airway Management Planned: Simple Face Mask  Additional Equipment:   Intra-op Plan:   Post-operative Plan:   Informed Consent: I have reviewed the patients History and Physical, chart, labs and discussed the procedure including the risks, benefits and alternatives for the proposed anesthesia with the patient or authorized representative who has indicated his/her understanding and acceptance.     Dental advisory given  Plan Discussed with: CRNA  Anesthesia Plan Comments:         Anesthesia Quick Evaluation

## 2021-03-29 NOTE — Discharge Instructions (Addendum)

## 2021-03-29 NOTE — Transfer of Care (Signed)
Immediate Anesthesia Transfer of Care Note  Patient: Adam Schwartz  Procedure(s) Performed: RIGHT CARPAL TUNNEL RELEASE (Right: Wrist) RELEASE TRIGGER FINGER/A-1 PULLEY RIGHT MIDDLE FINGER (Right: Hand)  Patient Location: PACU  Anesthesia Type:Bier block  Level of Consciousness: awake, alert  and oriented  Airway & Oxygen Therapy: Patient Spontanous Breathing and Patient connected to face mask oxygen  Post-op Assessment: Report given to RN and Post -op Vital signs reviewed and stable  Post vital signs: Reviewed and stable  Last Vitals:  Vitals Value Taken Time  BP 136/58 03/29/21 0930  Temp 36.6 C 03/29/21 0929  Pulse 59 03/29/21 0930  Resp 18 03/29/21 0928  SpO2 100 % 03/29/21 0930  Vitals shown include unvalidated device data.  Last Pain:  Vitals:   03/29/21 0718  TempSrc:   PainSc: 0-No pain         Complications: No notable events documented.

## 2021-03-29 NOTE — Anesthesia Postprocedure Evaluation (Signed)
Anesthesia Post Note  Patient: Rosary Lively  Procedure(s) Performed: RIGHT CARPAL TUNNEL RELEASE (Right: Wrist) RELEASE TRIGGER FINGER/A-1 PULLEY RIGHT MIDDLE FINGER (Right: Hand)     Patient location during evaluation: PACU Anesthesia Type: Bier Block Level of consciousness: awake and alert Pain management: pain level controlled Vital Signs Assessment: post-procedure vital signs reviewed and stable Respiratory status: spontaneous breathing, nonlabored ventilation and respiratory function stable Cardiovascular status: blood pressure returned to baseline and stable Postop Assessment: no apparent nausea or vomiting Anesthetic complications: no   No notable events documented.  Last Vitals:  Vitals:   03/29/21 0945 03/29/21 0958  BP: (!) 132/56 131/65  Pulse: 60 60  Resp: 19 17  Temp:  36.5 C  SpO2: 97% 97%    Last Pain:  Vitals:   03/29/21 0958  TempSrc:   PainSc: 0-No pain                 Lynda Rainwater

## 2021-03-29 NOTE — Anesthesia Procedure Notes (Signed)
Anesthesia Regional Block: Bier block (IV Regional)   Pre-Anesthetic Checklist: , timeout performed,  Correct Patient, Correct Site, Correct Laterality,  Correct Procedure, Correct Position, site marked,  Risks and benefits discussed,  Surgical consent,  Pre-op evaluation,  At surgeon's request  Laterality: Left  Prep: alcohol swabs        Procedures:,,,,, intact distal pulses, Esmarch exsanguination,  Single tourniquet utilized,  #20gu IV placed    Narrative:  Start time: 03/29/2021 8:45 AM End time: 03/29/2021 8:46 AM  Performed by: Personally

## 2021-03-29 NOTE — H&P (Signed)
Numbness right catching right middle fingr Adam Schwartz is an 70 y.o. male.   Chief Complaint: numbness right and catching right middle finger HPI: Adam Schwartz is a  70 year old male referred by Dr. Posey Pronto for consultation regarding triggering of his right middle finger. He is also complaining of numbness and tingling. both hands. This been going on for 3 to 4 weeks with catching of his right middle finger. The numbness and tingling is been going on for several months. He states that the catching has gotten better. He is occasionally awakened at night. Has no history of injury to the hand or to the neck. He has not been wearing braces using a topical. He has not had nerve conductions done at the present time by Dr. Posey Pronto but she apparently has discussed the possibility of that with him. He states that the numbness and tingling is constant although the fingers are involved. He has a history of diabetes no history of thyroid problems arthritis gout. Family history is positive for gout. He has been on meloxicam He was sent for nerve conductions with Dr. 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 More significant on his right than left. He has a chronic C7 radiculopathy bilateral. He continues to have the triggering of his middle finger right hand. He continues to have the numbness and tinglingbilaterally. he has had 2 injections to the right middle finger on 11/03/2020 and 12/10/2020. Past Medical History:  Diagnosis Date   Diabetes mellitus without complication ([place])    TYPE 2    Hypertension    Hypothyroidism     Past Surgical History:  Procedure Laterality Date   MOLE REMOVED      PENILE PROSTHESIS IMPLANT     RIGHT BROKEN ANKLE SURGERY       Family History  Problem Relation Age of Onset   Anxiety disorder Mother    Heart attack Father 75   Lung cancer Brother    Social History:  reports that he has never smoked. He has never used smokeless tobacco. He reports current alcohol use.  He reports that he does not use drugs.  Allergies:  Allergies  Allergen Reactions   Amoxicillin-Pot Clavulanate Diarrhea and Nausea And Vomiting    No medications prior to admission.    No results found for this or any previous visit (from the past 48 hour(s)).  No results found.   Pertinent items are noted in HPI.  There were no vitals taken for this visit.  General appearance: alert, cooperative, and appears stated age Head: Normocephalic, without obvious abnormality Neck: no JVD Resp: clear to auscultation bilaterally Cardio: regular rate and rhythm, S1, S2 normal, no murmur, click, rub or gallop GI: soft, non-tender; bowel sounds normal; no masses,  no organomegaly Extremities: numbness right and catching right middle finger  Pulses: 2+ and symmetric Skin: Skin color, texture, turgor normal. No rashes or lesions Neurologic: Grossly normal Incision/Wound: na  Assessment/Plan Diagnosed carpal tunnel syndrome right hand triggering right middle finger  Plan: We have discussed surgical release of the A1 pulley and carpal tunnel release right hand. Preperi-and postoperative course been discussed along with risk and complications. He is aware that there is no guarantee to the surgery the possibility of infection recurrence injury to arteries nerves tendons complete relief symptoms and dystrophy. He would like to proceed and is scheduled for release under regional anesthesia. He is advised that we are attempting to halt the procedure and the median nerve giving the nerve the opportunity to get  better but cannot guarantee that. Daryll Brod 03/29/2021, 5:05 AM

## 2021-03-29 NOTE — Brief Op Note (Signed)
03/29/2021  9:29 AM  PATIENT:  Adam Schwartz  70 y.o. male  PRE-OPERATIVE DIAGNOSIS:  RIGHT CARPAL TUNNEL SYNDROME, RIGHT MIDDLE FINGER TRIGGER FINGER  POST-OPERATIVE DIAGNOSIS:  RIGHT CARPAL TUNNEL SYNDROME, RIGHT MIDDLE FINGER TRIGGER FINGER  PROCEDURE:  Procedure(s) with comments: RIGHT CARPAL TUNNEL RELEASE (Right) - 60 MIN RELEASE TRIGGER FINGER/A-1 PULLEY RIGHT MIDDLE FINGER (Right)  SURGEON:  Surgeon(s) and Role:    * Daryll Brod, MD - Primary  PHYSICIAN ASSISTANT:   ASSISTANTS: none   ANESTHESIA:   local, regional, and IV sedation  EBL:  3 mL   BLOOD ADMINISTERED:none  DRAINS: none   LOCAL MEDICATIONS USED:  BUPIVICAINE   SPECIMEN:  No Specimen  DISPOSITION OF SPECIMEN:  N/A  COUNTS:  YES  TOURNIQUET:   Total Tourniquet Time Documented: Forearm (Right) - 31 minutes Total: Forearm (Right) - 31 minutes   DICTATION: .Viviann Spare Dictation  PLAN OF CARE: Discharge to home after PACU  PATIENT DISPOSITION:  PACU - hemodynamically stable.

## 2021-03-29 NOTE — Op Note (Signed)
NAME: Adam Schwartz MEDICAL RECORD NO: [id number] DATE OF BIRTH: Aug 10, 1951 FACILITY: [facility] LOCATION: [facility] OR PHYSICIAN: Wynonia Sours, MD   OPERATIVE REPORT   DATE OF PROCEDURE: 03/29/21    PREOPERATIVE DIAGNOSIS: Carpal tunnel syndrome stenosing tenosynovitis right middle finger right hand   POSTOPERATIVE DIAGNOSIS: Same   PROCEDURE: Release A1 pulley right middle finger with decompression median nerve right wrist   SURGEON: Daryll Brod, M.D.   ASSISTANT: none   ANESTHESIA:  Bier block with sedation and Local   INTRAVENOUS FLUIDS:  Per anesthesia flow sheet.   ESTIMATED BLOOD LOSS:  Minimal.   COMPLICATIONS:  None.   SPECIMENS:  none   TOURNIQUET TIME:    Total Tourniquet Time Documented: Forearm (Right) - 31 minutes Total: Forearm (Right) - 31 minutes    DISPOSITION:  Stable to PACU.   INDICATIONS: Patient is a 70 year old male with history of numbness and tingling along with triggering of his right middle finger with numbness and tingling right hand.  This not responded to conservative treatment.  Nerve conductions are positive for carpal tunnel syndrome.  He is elected undergo surgical decompression of the median nerve release A1 pulley right middle finger.  Pre-.  Postoperative course been discussed along with risk and complications.  He is aware there is no guarantee to the surgery the possibility of infection recurrence injury to arteries nerves tendons incomplete relief of symptoms dystrophy.  Preoperative area the patient seen extremity marked by both patient and surgeon antibiotic  OPERATIVE COURSE: Patient is brought to the operating room placed in the supine position with the right arm free.  A forearm IV regional anesthetic was carried out without difficulty under the direction the anesthesia department.  He was prepped with ChloraPrep.  3-minute dry time was allowed timeout taken to confirm patient procedure.  An oblique incision was made over the A1 pulley of  the right middle finger carried down through subcutaneous tissue.  Bleeders were electrocauterized with bipolar.  Dissection carried down to the A1 pulley.  Retractors were placed directly neurovascular bundles radially and ulnarly.  The A1 pulley was then released at its radial aspect a small incision was made centrally and A2.  The 2 tendons were then separated with blunt dissection breaking any adhesions.  The finger was placed to the full range of motion no further triggering was noted.  The wound was irrigated and closed with erupted 4 nylon sutures.  The carpal tunnel was attended to next.  A longitudinal incision was made in the right palm carried down through subcutaneous tissue..  Bleeders were again electrocauterized with bipolar.  The palmar fascia was split longitudinally.  Dissection was carried distally identifying the palmar arch.  The flexor tendon to the ring little finger were identified retractors were placed retracting median nerve radial ulnar nerve ulnarly.  Flexor retinaculum was then released on its ulnar border with sharp dissection.  Right angle and stool retractor placed between skin forearm fascia.  Deep structures dissected free.  The proximal aspect of the flexor retinaculum distal forearm fascia was then released under direct vision for approximately 3 cm proximal to the wrist crease.  The canal was explored.  Motor branch entering the muscle distally.  An area compression of the nerve was apparent.  No further lesions were identified.  Tenosynovial tissue was moderately thickened.  The wound was copiously irrigated with saline.  It was closed with interrupted 4-0 nylon sutures.  A local infiltration with quarter percent bupivacaine without epinephrine was given  approximately 10 cc was used total for each wound. A sterile compressive dressing with the fingers free was applied.  Deflation of the tourniquet all fingers immediately pink.  He was taken to the recovery room for  observation in satisfactory condition.  He will be discharged home to return to the hand center [facility] in 1 week Tylenol ibuprofen for pain with Ultram for breakthrough.  Daryll Brod, MD Electronically signed, 03/29/21

## 2021-03-30 ENCOUNTER — Encounter (HOSPITAL_COMMUNITY): Payer: Self-pay | Admitting: Orthopedic Surgery

## 2021-10-18 ENCOUNTER — Encounter: Payer: Self-pay | Admitting: Gastroenterology

## 2021-10-18 ENCOUNTER — Other Ambulatory Visit: Payer: Self-pay

## 2021-10-18 ENCOUNTER — Ambulatory Visit (INDEPENDENT_AMBULATORY_CARE_PROVIDER_SITE_OTHER): Payer: Medicare Other | Admitting: Gastroenterology

## 2021-10-18 VITALS — BP 116/68 | HR 75 | Ht 66.0 in | Wt 187.0 lb

## 2021-10-18 DIAGNOSIS — Z86010 Personal history of colonic polyps: Secondary | ICD-10-CM

## 2021-10-18 DIAGNOSIS — Z8371 Family history of colonic polyps: Secondary | ICD-10-CM | POA: Diagnosis not present

## 2021-10-18 NOTE — Progress Notes (Signed)
Chief Complaint:   Referring Provider:  Bonnita Nasuti, MD      ASSESSMENT AND PLAN;   #1. H/O colon polyps  #2. FH colon polyps (brother at age 69)   Plan: -Colon with clenpiq (samples given)   Discussed risks & benefits of colonoscopy. Risks including rare perforation req laparotomy, bleeding after bx/polypectomy req blood transfusion, rarely missing neoplasms, risks of anesthesia/sedation, rare risk of damage to internal organs. Benefits outweigh the risks. Patient agrees to proceed. All the questions were answered. Pt consents to proceed.  HPI:    Adam Schwartz is a 71 y.o. male  With DM2, HTN, hypothyroidism  Here for colonoscopy due to history of polyps.  No nausea, vomiting, heartburn, regurgitation, odynophagia or dysphagia.  No significant diarrhea or constipation (on stool softner).  No melena or hematochezia. No unintentional weight loss. No abdominal pain.  Previous GI work-up Colonoscopy 08/25/2015 ([facility]): 1.2 cm colon polyp in mid sigmoid colon s/p polypectomy. Bx- TA.  Mild sigmoid diverticulosis.  Recommended to repeat in 3 years.  He did get letters but did not come until today.  Past Medical History:  Diagnosis Date   Diabetes mellitus without complication ([place])    TYPE 2    Hypertension    Hypothyroidism     Past Surgical History:  Procedure Laterality Date   CARPAL TUNNEL RELEASE Right 03/29/2021   Procedure: RIGHT CARPAL TUNNEL RELEASE;  Surgeon: Daryll Brod, MD;  Location: [place];  Service: Orthopedics;  Laterality: Right;  60 MIN   MOLE REMOVED      PENILE PROSTHESIS IMPLANT     RIGHT BROKEN ANKLE SURGERY      TRIGGER FINGER RELEASE Right 03/29/2021   Procedure: RELEASE TRIGGER FINGER/A-1 PULLEY RIGHT MIDDLE FINGER;  Surgeon: Daryll Brod, MD;  Location: [place];  Service: Orthopedics;  Laterality: Right;    Family History  Problem Relation Age of Onset   Anxiety disorder Mother    Heart attack Father 37   Lung cancer Brother     Social  History   Tobacco Use   Smoking status: Never   Smokeless tobacco: Never  Vaping Use   Vaping Use: Never used  Substance Use Topics   Alcohol use: Yes    Comment: ocassional- beers   Drug use: Never    Current Outpatient Medications  Medication Sig Dispense Refill   acyclovir (ZOVIRAX) 400 MG tablet Take 200 mg by mouth at bedtime.     Ascorbic Acid (VITAMIN C) 1000 MG tablet Take 1,000 mg by mouth daily.     Cholecalciferol (VITAMIN D3) 50 MCG (2000 UT) TABS Take 2,000 Units by mouth daily.     Cyanocobalamin (VITAMIN B 12 PO) Take 1,000 mcg by mouth daily.     diphenhydrAMINE (BENADRYL) 50 MG tablet Take 50 mg by mouth at bedtime as needed for itching.     Empagliflozin-metFORMIN HCl (SYNJARDY) 12.02-999 MG TABS Take 2 tablets by mouth daily.     levothyroxine (SYNTHROID) 50 MCG tablet Take 50 mcg by mouth daily before breakfast.     lisinopril (ZESTRIL) 5 MG tablet Take 5 mg by mouth daily.     meloxicam (MOBIC) 15 MG tablet Take 15 mg by mouth daily.     OZEMPIC, 1 MG/DOSE, 4 MG/3ML SOPN Inject 1 mg into the skin once a week. Every tuesday     pioglitazone (ACTOS) 45 MG tablet Take 45 mg by mouth daily.     sennosides-docusate sodium (SENOKOT-S) 8.6-50 MG tablet Take 1  tablet by mouth daily.     sildenafil (VIAGRA) 25 MG tablet 1 tablet as needed     simvastatin (ZOCOR) 40 MG tablet Take 40 mg by mouth at bedtime.     tiZANidine (ZANAFLEX) 4 MG capsule Take 4 mg by mouth at bedtime as needed for muscle spasms.     traMADol (ULTRAM) 50 MG tablet Take 1 tablet (50 mg total) by mouth every 6 (six) hours as needed. 20 tablet 0   No current facility-administered medications for this visit.    Allergies  Allergen Reactions   Amoxicillin-Pot Clavulanate Diarrhea and Nausea And Vomiting    Review of Systems:  Constitutional: Denies fever, chills, diaphoresis, appetite change and fatigue.  HEENT: Denies photophobia, eye pain, redness, hearing loss, ear pain, congestion, sore  throat, rhinorrhea, sneezing, mouth sores, neck pain, neck stiffness and tinnitus.   Respiratory: Denies SOB, DOE, cough, chest tightness,  and wheezing.   Cardiovascular: Denies chest pain, palpitations and leg swelling.  Genitourinary: Denies dysuria, urgency, frequency, hematuria, flank pain and difficulty urinating.  Musculoskeletal: Denies myalgias, back pain, joint swelling, arthralgias and gait problem.  Skin: No rash.  Neurological: Denies dizziness, seizures, syncope, weakness, light-headedness, numbness and headaches.  Hematological: Denies adenopathy. Easy bruising, personal or family bleeding history  Psychiatric/Behavioral: No anxiety or depression     Physical Exam:    BP 116/68    Pulse 75    Ht 5\' 6"  (1.676 m)    Wt 187 lb (84.8 kg)    SpO2 99%    BMI 30.18 kg/m  Wt Readings from Last 3 Encounters:  10/18/21 187 lb (84.8 kg)  03/29/21 184 lb (83.5 kg)  01/04/21 181 lb (82.1 kg)   Constitutional:  Well-developed, in no acute distress. Psychiatric: Normal mood and affect. Behavior is normal. HEENT: Pupils normal.  Conjunctivae are normal. No scleral icterus. Cardiovascular: Normal rate, regular rhythm. No edema Pulmonary/chest: Effort normal and breath sounds normal. No wheezing, rales or rhonchi. Abdominal: Soft, nondistended. Nontender. Bowel sounds active throughout. There are no masses palpable. No hepatomegaly. Rectal: Deferred Neurological: Alert and oriented to person place and time. Skin: Skin is warm and dry. No rashes noted.  Data Reviewed: I have personally reviewed following labs and imaging studies  CBC: CBC Latest Ref Rng & Units 07/08/2020  WBC 4.0 - 10.5 K/uL 5.9  Hemoglobin 13.0 - 17.0 g/dL 13.5  Hematocrit 39.0 - 52.0 % 41.5  Platelets 150 - 400 K/uL 226    CMP: CMP Latest Ref Rng & Units 03/29/2021 07/08/2020  Glucose 70 - 99 mg/dL 118(H) 153(H)  BUN 8 - 23 mg/dL 17 15  Creatinine 0.61 - 1.24 mg/dL 0.77 0.87  Sodium 135 - 145 mmol/L 140 138   Potassium 3.5 - 5.1 mmol/L 4.2 4.4  Chloride 98 - 111 mmol/L 103 103  CO2 22 - 32 mmol/L 24 25  Calcium 8.9 - 10.3 mg/dL 9.6 9.3        Carmell Austria, MD 10/18/2021, 1:58 PM  Cc: Bonnita Nasuti, MD

## 2021-10-18 NOTE — Patient Instructions (Addendum)
If you are age 69 or older, your body mass index should be between 23-30. Your Body mass index is 30.18 kg/m. If this is out of the aforementioned range listed, please consider follow up with your Primary Care Provider.  If you are age 6 or younger, your body mass index should be between 19-25. Your Body mass index is 30.18 kg/m. If this is out of the aformentioned range listed, please consider follow up with your Primary Care Provider.   ________________________________________________________  The [facility] providers would like to encourage you to use [facility] to communicate with providers for non-urgent requests or questions.  Due to long hold times on the telephone, sending your provider a message by [facility] may be a faster and more efficient way to get a response.  Please allow 48 business hours for a response.  Please remember that this is for non-urgent requests.  _______________________________________________________  We have given you samples of the following medication to take: Clenpiq  You have been scheduled for a colonoscopy. Please follow written instructions given to you at your visit today.  Please pick up your prep supplies at the pharmacy within the next 1-3 days. If you use inhalers (even only as needed), please bring them with you on the day of your procedure.  Thank you,  Dr. Jackquline Denmark

## 2021-10-20 ENCOUNTER — Ambulatory Visit: Payer: Medicare Other | Admitting: Gastroenterology

## 2021-11-17 ENCOUNTER — Encounter: Payer: Self-pay | Admitting: Gastroenterology

## 2021-11-23 ENCOUNTER — Ambulatory Visit (AMBULATORY_SURGERY_CENTER): Payer: Medicare Other | Admitting: Gastroenterology

## 2021-11-23 ENCOUNTER — Encounter: Payer: Self-pay | Admitting: Gastroenterology

## 2021-11-23 ENCOUNTER — Other Ambulatory Visit: Payer: Self-pay

## 2021-11-23 ENCOUNTER — Telehealth: Payer: Self-pay | Admitting: *Deleted

## 2021-11-23 VITALS — BP 121/51 | HR 73 | Temp 96.8°F | Resp 14 | Ht 66.0 in | Wt 187.0 lb

## 2021-11-23 DIAGNOSIS — Z86010 Personal history of colonic polyps: Secondary | ICD-10-CM

## 2021-11-23 DIAGNOSIS — Z1211 Encounter for screening for malignant neoplasm of colon: Secondary | ICD-10-CM

## 2021-11-23 MED ORDER — SODIUM CHLORIDE 0.9 % IV SOLN: 500.0000 mL | Freq: Once | INTRAVENOUS | Status: DC

## 2021-11-23 NOTE — Progress Notes (Signed)
Pt's states no medical or surgical changes since previsit or office visit. 

## 2021-11-23 NOTE — Patient Instructions (Signed)
Discharge instructions given. Rescheduled procedure. Resume previous medications. YOU HAD AN ENDOSCOPIC PROCEDURE TODAY AT [facility]:   Refer to the procedure report that was given to you for any specific questions about what was found during the examination.  If the procedure report does not answer your questions, please call your gastroenterologist to clarify.  If you requested that your care partner not be given the details of your procedure findings, then the procedure report has been included in a sealed envelope for you to review at your convenience later.  YOU SHOULD EXPECT: Some feelings of bloating in the abdomen. Passage of more gas than usual.  Walking can help get rid of the air that was put into your GI tract during the procedure and reduce the bloating. If you had a lower endoscopy (such as a colonoscopy or flexible sigmoidoscopy) you may notice spotting of blood in your stool or on the toilet paper. If you underwent a bowel prep for your procedure, you may not have a normal bowel movement for a few days.  Please Note:  You might notice some irritation and congestion in your nose or some drainage.  This is from the oxygen used during your procedure.  There is no need for concern and it should clear up in a day or so.  SYMPTOMS TO REPORT IMMEDIATELY:  Following lower endoscopy (colonoscopy or flexible sigmoidoscopy):  Excessive amounts of blood in the stool  Significant tenderness or worsening of abdominal pains  Swelling of the abdomen that is new, acute  Fever of 100F or higher  For urgent or emergent issues, a gastroenterologist can be reached at any hour by calling [phone]. Do not use MyChart messaging for urgent concerns.    DIET:  We do recommend a small meal at first, but then you may proceed to your regular diet.  Drink plenty of fluids but you should avoid alcoholic beverages for 24 hours.  ACTIVITY:  You should plan to take it easy for the rest  of today and you should NOT DRIVE or use heavy machinery until tomorrow (because of the sedation medicines used during the test).    FOLLOW UP: Our staff will call the number listed on your records 48-72 hours following your procedure to check on you and address any questions or concerns that you may have regarding the information given to you following your procedure. If we do not reach you, we will leave a message.  We will attempt to reach you two times.  During this call, we will ask if you have developed any symptoms of COVID 19. If you develop any symptoms (ie: fever, flu-like symptoms, shortness of breath, cough etc.) before then, please call [phone].  If you test positive for Covid 19 in the 2 weeks post procedure, please call and report this information to [place].    If any biopsies were taken you will be contacted by phone or by letter within the next 1-3 weeks.  Please call us at [phone] if you have not heard about the biopsies in 3 weeks.    SIGNATURES/CONFIDENTIALITY: You and/or your care partner have signed paperwork which will be entered into your electronic medical record.  These signatures attest to the fact that that the information above on your After Visit Summary has been reviewed and is understood.  Full responsibility of the confidentiality of this discharge information lies with you and/or your care-partner.

## 2021-11-23 NOTE — Progress Notes (Signed)
Chief Complaint:   Referring Provider:  Bonnita Nasuti, MD      ASSESSMENT AND PLAN;   #1. H/O colon polyps  #2. FH colon polyps (brother at age 69)   Plan: -Colon with clenpiq (samples given)   Discussed risks & benefits of colonoscopy. Risks including rare perforation req laparotomy, bleeding after bx/polypectomy req blood transfusion, rarely missing neoplasms, risks of anesthesia/sedation, rare risk of damage to internal organs. Benefits outweigh the risks. Patient agrees to proceed. All the questions were answered. Pt consents to proceed.  HPI:    Adam Schwartz is a 71 y.o. male  With DM2, HTN, hypothyroidism  Here for colonoscopy due to history of polyps.  No nausea, vomiting, heartburn, regurgitation, odynophagia or dysphagia.  No significant diarrhea or constipation (on stool softner).  No melena or hematochezia. No unintentional weight loss. No abdominal pain.  Previous GI work-up Colonoscopy 08/25/2015 ([facility]): 1.2 cm colon polyp in mid sigmoid colon s/p polypectomy. Bx- TA.  Mild sigmoid diverticulosis.  Recommended to repeat in 3 years.  He did get letters but did not come until today.  Past Medical History:  Diagnosis Date   Diabetes mellitus without complication ([place])    TYPE 2    Hypertension    Hypothyroidism     Past Surgical History:  Procedure Laterality Date   CARPAL TUNNEL RELEASE Right 03/29/2021   Procedure: RIGHT CARPAL TUNNEL RELEASE;  Surgeon: Daryll Brod, MD;  Location: [facility];  Service: Orthopedics;  Laterality: Right;  60 MIN   MOLE REMOVED      PENILE PROSTHESIS IMPLANT     RIGHT BROKEN ANKLE SURGERY      TRIGGER FINGER RELEASE Right 03/29/2021   Procedure: RELEASE TRIGGER FINGER/A-1 PULLEY RIGHT MIDDLE FINGER;  Surgeon: Daryll Brod, MD;  Location: [facility];  Service: Orthopedics;  Laterality: Right;    Family History  Problem Relation Age of Onset   Anxiety disorder Mother    Heart attack Father 39   Lung cancer Brother     Social  History   Tobacco Use   Smoking status: Never   Smokeless tobacco: Never  Vaping Use   Vaping Use: Never used  Substance Use Topics   Alcohol use: Yes    Comment: ocassional- beers   Drug use: Never    Current Outpatient Medications  Medication Sig Dispense Refill   Ascorbic Acid (VITAMIN C) 1000 MG tablet Take 1,000 mg by mouth daily.     Cholecalciferol (VITAMIN D3) 50 MCG (2000 UT) TABS Take 2,000 Units by mouth daily.     Cyanocobalamin (VITAMIN B 12 PO) Take 1,000 mcg by mouth daily.     diphenhydrAMINE (BENADRYL) 50 MG tablet Take 50 mg by mouth at bedtime as needed for itching.     Empagliflozin-metFORMIN HCl (SYNJARDY) 12.02-999 MG TABS Take 2 tablets by mouth daily.     levothyroxine (SYNTHROID) 50 MCG tablet Take 50 mcg by mouth daily before breakfast.     lisinopril (ZESTRIL) 5 MG tablet Take 5 mg by mouth daily.     meloxicam (MOBIC) 15 MG tablet Take 15 mg by mouth daily.     OZEMPIC, 1 MG/DOSE, 4 MG/3ML SOPN Inject 1 mg into the skin once a week. Every tuesday     pioglitazone (ACTOS) 45 MG tablet Take 45 mg by mouth daily.     sennosides-docusate sodium (SENOKOT-S) 8.6-50 MG tablet Take 1 tablet by mouth daily.     simvastatin (ZOCOR) 40 MG tablet Take 40 mg  by mouth at bedtime.     tiZANidine (ZANAFLEX) 4 MG capsule Take 4 mg by mouth at bedtime as needed for muscle spasms.     traMADol (ULTRAM) 50 MG tablet Take 1 tablet (50 mg total) by mouth every 6 (six) hours as needed. 20 tablet 0   acyclovir (ZOVIRAX) 400 MG tablet Take 200 mg by mouth at bedtime.     sildenafil (VIAGRA) 25 MG tablet 1 tablet as needed     Current Facility-Administered Medications  Medication Dose Route Frequency Provider Last Rate Last Admin   0.9 %  sodium chloride infusion  500 mL Intravenous Once Jackquline Denmark, MD        Allergies  Allergen Reactions   Amoxicillin-Pot Clavulanate Diarrhea and Nausea And Vomiting    Review of Systems:  Constitutional: Denies fever, chills,  diaphoresis, appetite change and fatigue.  HEENT: Denies photophobia, eye pain, redness, hearing loss, ear pain, congestion, sore throat, rhinorrhea, sneezing, mouth sores, neck pain, neck stiffness and tinnitus.   Respiratory: Denies SOB, DOE, cough, chest tightness,  and wheezing.   Cardiovascular: Denies chest pain, palpitations and leg swelling.  Genitourinary: Denies dysuria, urgency, frequency, hematuria, flank pain and difficulty urinating.  Musculoskeletal: Denies myalgias, back pain, joint swelling, arthralgias and gait problem.  Skin: No rash.  Neurological: Denies dizziness, seizures, syncope, weakness, light-headedness, numbness and headaches.  Hematological: Denies adenopathy. Easy bruising, personal or family bleeding history  Psychiatric/Behavioral: No anxiety or depression     Physical Exam:    BP (!) 129/58    Pulse 71    Temp (!) 96.8 F (36 C) (Temporal)    Ht 5\' 6"  (1.676 m)    Wt 187 lb (84.8 kg)    SpO2 100%    BMI 30.18 kg/m  Wt Readings from Last 3 Encounters:  11/23/21 187 lb (84.8 kg)  10/18/21 187 lb (84.8 kg)  03/29/21 184 lb (83.5 kg)   Constitutional:  Well-developed, in no acute distress. Psychiatric: Normal mood and affect. Behavior is normal. HEENT: Pupils normal.  Conjunctivae are normal. No scleral icterus. Cardiovascular: Normal rate, regular rhythm. No edema Pulmonary/chest: Effort normal and breath sounds normal. No wheezing, rales or rhonchi. Abdominal: Soft, nondistended. Nontender. Bowel sounds active throughout. There are no masses palpable. No hepatomegaly. Rectal: Deferred Neurological: Alert and oriented to person place and time. Skin: Skin is warm and dry. No rashes noted.  Data Reviewed: I have personally reviewed following labs and imaging studies  CBC: CBC Latest Ref Rng & Units 07/08/2020  WBC 4.0 - 10.5 K/uL 5.9  Hemoglobin 13.0 - 17.0 g/dL 13.5  Hematocrit 39.0 - 52.0 % 41.5  Platelets 150 - 400 K/uL 226    CMP: CMP Latest  Ref Rng & Units 03/29/2021 07/08/2020  Glucose 70 - 99 mg/dL 118(H) 153(H)  BUN 8 - 23 mg/dL 17 15  Creatinine 0.61 - 1.24 mg/dL 0.77 0.87  Sodium 135 - 145 mmol/L 140 138  Potassium 3.5 - 5.1 mmol/L 4.2 4.4  Chloride 98 - 111 mmol/L 103 103  CO2 22 - 32 mmol/L 24 25  Calcium 8.9 - 10.3 mg/dL 9.6 9.3        Carmell Austria, MD 11/23/2021, 2:30 PM  Cc: Bonnita Nasuti, MD

## 2021-11-23 NOTE — Progress Notes (Signed)
PT taken to PACU. Monitors in place. VSS. Report given to RN. 

## 2021-11-23 NOTE — Op Note (Signed)
[facility] Patient Name: Adam Schwartz Procedure Date: 11/23/2021 2:29 PM MRN: [id number] Endoscopist: Jackquline Denmark , MD Age: 72 Referring MD:  Date of Birth: 09-02-1951 Gender: Male Account #: [id number] Procedure:                Colonoscopy Indications:              High risk colon cancer surveillance: Personal                            history of colonic polyps. FH of colon polyps Medicines:                Monitored Anesthesia Care Procedure:                Pre-Anesthesia Assessment:                           - Prior to the procedure, a History and Physical                            was performed, and patient medications and                            allergies were reviewed. The patient's tolerance of                            previous anesthesia was also reviewed. The risks                            and benefits of the procedure and the sedation                            options and risks were discussed with the patient.                            All questions were answered, and informed consent                            was obtained. Prior Anticoagulants: The patient has                            taken no previous anticoagulant or antiplatelet                            agents. ASA Grade Assessment: II - A patient with                            mild systemic disease. After reviewing the risks                            and benefits, the patient was deemed in                            satisfactory condition to undergo the procedure.  After obtaining informed consent, the colonoscope                            was passed under direct vision. Throughout the                            procedure, the patient's blood pressure, pulse, and                            oxygen saturations were monitored continuously. The                            Colonoscope was introduced through the anus with                            the intention of  advancing to the cecum. The scope                            was advanced to the sigmoid colon before the                            procedure was aborted. Medications were given. The                            colonoscopy was performed without difficulty. The                            patient tolerated the procedure well. The quality                            of the bowel preparation was unsatisfactory. Scope In: 2:40:06 PM Scope Out: 2:42:31 PM Total Procedure Duration: 0 hours 2 minutes 25 seconds  Findings:                 The colon could not be examined due to the poor                            preparation and retained stool. Complications:            No immediate complications. Estimated Blood Loss:     Estimated blood loss: none. Impression:               - Preparation of the colon was unsatisfactory.                           - The sigmoid colon is normal.                           - No specimens collected. Recommendation:           - Patient has a contact number available for                            emergencies. The signs and symptoms of potential  delayed complications were discussed with the                            patient. Return to normal activities tomorrow.                            Written discharge instructions were provided to the                            patient.                           - Resume previous diet.                           - Continue present medications.                           - Repeat colonoscopy at the next available                            appointment for surveillance with 2-day prep. Jackquline Denmark, MD 11/23/2021 2:45:19 PM This report has been signed electronically.

## 2021-11-23 NOTE — Telephone Encounter (Signed)
Pre visit instructions given to patient and he verbalized understanding.

## 2021-11-25 ENCOUNTER — Telehealth: Payer: Self-pay

## 2021-11-25 NOTE — Telephone Encounter (Signed)
°  Follow up Call-  Call back number 11/23/2021  Post procedure Call Back phone  # [phone]  Permission to leave phone message Yes  Some recent data might be hidden     Patient questions:  Do you have a fever, pain , or abdominal swelling? No. Pain Score  0 *  Have you tolerated food without any problems? Yes.    Have you been able to return to your normal activities? Yes.    Do you have any questions about your discharge instructions: Diet   No. Medications  No. Follow up visit  No.  Do you have questions or concerns about your Care? No.  Actions: * If pain score is 4 or above: No action needed, pain <4.

## 2022-01-03 ENCOUNTER — Encounter: Payer: Self-pay | Admitting: Gastroenterology

## 2022-01-05 ENCOUNTER — Ambulatory Visit (AMBULATORY_SURGERY_CENTER): Payer: Medicare Other | Admitting: Gastroenterology

## 2022-01-05 ENCOUNTER — Encounter: Payer: Self-pay | Admitting: Gastroenterology

## 2022-01-05 VITALS — BP 112/65 | HR 61 | Temp 96.2°F | Resp 10 | Ht 66.0 in | Wt 187.0 lb

## 2022-01-05 DIAGNOSIS — K621 Rectal polyp: Secondary | ICD-10-CM | POA: Diagnosis not present

## 2022-01-05 DIAGNOSIS — Z86010 Personal history of colonic polyps: Secondary | ICD-10-CM | POA: Diagnosis not present

## 2022-01-05 DIAGNOSIS — D128 Benign neoplasm of rectum: Secondary | ICD-10-CM

## 2022-01-05 HISTORY — PX: COLONOSCOPY: SHX174

## 2022-01-05 MED ORDER — SODIUM CHLORIDE 0.9 % IV SOLN: 500.0000 mL | Freq: Once | INTRAVENOUS | Status: DC

## 2022-01-05 NOTE — Progress Notes (Signed)
? ? ?Chief Complaint:  ? ?Referring Provider:  Bonnita Nasuti, MD    ? ? ?ASSESSMENT AND PLAN;  ? ?#1. H/O colon polyps ? ?#2. FH colon polyps (brother at age 69) ? ? ?Plan: ?-Colon with clenpiq (samples given) ? ? ?Discussed risks & benefits of colonoscopy. Risks including rare perforation req laparotomy, bleeding after bx/polypectomy req blood transfusion, rarely missing neoplasms, risks of anesthesia/sedation, rare risk of damage to internal organs. Benefits outweigh the risks. Patient agrees to proceed. All the questions were answered. Pt consents to proceed. ? ?HPI:   ? ?Adam Schwartz is a 71 y.o. male  ?With DM2, HTN, hypothyroidism ? ?Here for colonoscopy due to history of polyps. ? ?No nausea, vomiting, heartburn, regurgitation, odynophagia or dysphagia.  No significant diarrhea or constipation (on stool softner).  No melena or hematochezia. No unintentional weight loss. No abdominal pain. ? ?Previous GI work-up ?Colonoscopy 08/25/2015 ([facility]): 1.2 cm colon polyp in mid sigmoid colon s/p polypectomy. Bx- TA.  Mild sigmoid diverticulosis.  Recommended to repeat in 3 years.  He did get letters but did not come until today. ? ?Past Medical History:  ?Diagnosis Date  ? Diabetes mellitus without complication ([place])   ? TYPE 2   ? Hypertension   ? Hypothyroidism   ? ? ?Past Surgical History:  ?Procedure Laterality Date  ? CARPAL TUNNEL RELEASE Right 03/29/2021  ? Procedure: RIGHT CARPAL TUNNEL RELEASE;  Surgeon: Daryll Brod, MD;  Location: [facility];  Service: Orthopedics;  Laterality: Right;  60 MIN  ? COLONOSCOPY  01/05/2022  ? MOLE REMOVED     ? PENILE PROSTHESIS IMPLANT    ? RIGHT BROKEN ANKLE SURGERY     ? TRIGGER FINGER RELEASE Right 03/29/2021  ? Procedure: RELEASE TRIGGER FINGER/A-1 PULLEY RIGHT MIDDLE FINGER;  Surgeon: Daryll Brod, MD;  Location: [facility];  Service: Orthopedics;  Laterality: Right;  ? ? ?Family History  ?Problem Relation Age of Onset  ? Anxiety disorder Mother   ? Heart attack Father 73  ? Lung  cancer Brother   ? Colon cancer Neg Hx   ? Colon polyps Neg Hx   ? Esophageal cancer Neg Hx   ? Rectal cancer Neg Hx   ? Stomach cancer Neg Hx   ? ? ?Social History  ? ?Tobacco Use  ? Smoking status: Never  ? Smokeless tobacco: Never  ?Vaping Use  ? Vaping Use: Never used  ?Substance Use Topics  ? Alcohol use: Yes  ?  Comment: ocassional- beers  ? Drug use: Never  ? ? ?Current Outpatient Medications  ?Medication Sig Dispense Refill  ? acyclovir (ZOVIRAX) 400 MG tablet Take 200 mg by mouth at bedtime.    ? Ascorbic Acid (VITAMIN C) 1000 MG tablet Take 1,000 mg by mouth daily.    ? Cholecalciferol (VITAMIN D3) 50 MCG (2000 UT) TABS Take 2,000 Units by mouth daily.    ? Cyanocobalamin (VITAMIN B 12 PO) Take 1,000 mcg by mouth daily.    ? diphenhydrAMINE (BENADRYL) 50 MG tablet Take 50 mg by mouth at bedtime as needed for itching.    ? Empagliflozin-metFORMIN HCl (SYNJARDY) 12.02-999 MG TABS Take 2 tablets by mouth daily.    ? levothyroxine (SYNTHROID) 50 MCG tablet Take 50 mcg by mouth daily before breakfast.    ? lisinopril (ZESTRIL) 5 MG tablet Take 5 mg by mouth daily.    ? meloxicam (MOBIC) 15 MG tablet Take 15 mg by mouth daily.    ? OZEMPIC, 1 MG/DOSE, 4  MG/3ML SOPN Inject 1 mg into the skin once a week. Every tuesday    ? pioglitazone (ACTOS) 45 MG tablet Take 45 mg by mouth daily.    ? sennosides-docusate sodium (SENOKOT-S) 8.6-50 MG tablet Take 1 tablet by mouth daily.    ? simvastatin (ZOCOR) 40 MG tablet Take 40 mg by mouth at bedtime.    ? tiZANidine (ZANAFLEX) 4 MG capsule Take 4 mg by mouth at bedtime as needed for muscle spasms.    ? BELSOMRA 20 MG TABS Take 1 tablet by mouth at bedtime as needed.    ? docusate sodium (COLACE) 50 MG capsule Take 1 capsule by mouth daily.    ? sildenafil (VIAGRA) 25 MG tablet 1 tablet as needed    ? traMADol (ULTRAM) 50 MG tablet Take 1 tablet (50 mg total) by mouth every 6 (six) hours as needed. 20 tablet 0  ? ?Current Facility-Administered Medications  ?Medication Dose  Route Frequency Provider Last Rate Last Admin  ? 0.9 %  sodium chloride infusion  500 mL Intravenous Once Jackquline Denmark, MD      ? 0.9 %  sodium chloride infusion  500 mL Intravenous Once Jackquline Denmark, MD      ? ? ?Allergies  ?Allergen Reactions  ? Amoxicillin-Pot Clavulanate Diarrhea and Nausea And Vomiting  ? ? ?Review of Systems:  ?Constitutional: Denies fever, chills, diaphoresis, appetite change and fatigue.  ?HEENT: Denies photophobia, eye pain, redness, hearing loss, ear pain, congestion, sore throat, rhinorrhea, sneezing, mouth sores, neck pain, neck stiffness and tinnitus.   ?Respiratory: Denies SOB, DOE, cough, chest tightness,  and wheezing.   ?Cardiovascular: Denies chest pain, palpitations and leg swelling.  ?Genitourinary: Denies dysuria, urgency, frequency, hematuria, flank pain and difficulty urinating.  ?Musculoskeletal: Denies myalgias, back pain, joint swelling, arthralgias and gait problem.  ?Skin: No rash.  ?Neurological: Denies dizziness, seizures, syncope, weakness, light-headedness, numbness and headaches.  ?Hematological: Denies adenopathy. Easy bruising, personal or family bleeding history  ?Psychiatric/Behavioral: No anxiety or depression ? ?  ? ?Physical Exam:   ? ?BP 140/69   Pulse 63   Temp (!) 96.2 ?F (35.7 ?C) (Temporal)   Resp 18   Ht '5\' 6"'$  (1.676 m)   Wt 187 lb (84.8 kg)   SpO2 99%   BMI 30.18 kg/m?  ?Wt Readings from Last 3 Encounters:  ?01/05/22 187 lb (84.8 kg)  ?11/23/21 187 lb (84.8 kg)  ?10/18/21 187 lb (84.8 kg)  ? ?Constitutional:  Well-developed, in no acute distress. ?Psychiatric: Normal mood and affect. Behavior is normal. ?HEENT: Pupils normal.  Conjunctivae are normal. No scleral icterus. ?Cardiovascular: Normal rate, regular rhythm. No edema ?Pulmonary/chest: Effort normal and breath sounds normal. No wheezing, rales or rhonchi. ?Abdominal: Soft, nondistended. Nontender. Bowel sounds active throughout. There are no masses palpable. No hepatomegaly. ?Rectal:  Deferred ?Neurological: Alert and oriented to person place and time. ?Skin: Skin is warm and dry. No rashes noted. ? ?Data Reviewed: I have personally reviewed following labs and imaging studies ? ?CBC: ? ?  Latest Ref Rng & Units 07/08/2020  ?  9:01 AM  ?CBC  ?WBC 4.0 - 10.5 K/uL 5.9    ?Hemoglobin 13.0 - 17.0 g/dL 13.5    ?Hematocrit 39.0 - 52.0 % 41.5    ?Platelets 150 - 400 K/uL 226    ? ? ?CMP: ? ?  Latest Ref Rng & Units 03/29/2021  ?  7:09 AM 07/08/2020  ?  9:01 AM  ?CMP  ?Glucose 70 - 99 mg/dL 118  153    ?BUN 8 - 23 mg/dL 17   15    ?Creatinine 0.61 - 1.24 mg/dL 0.77   0.87    ?Sodium 135 - 145 mmol/L 140   138    ?Potassium 3.5 - 5.1 mmol/L 4.2   4.4    ?Chloride 98 - 111 mmol/L 103   103    ?CO2 22 - 32 mmol/L 24   25    ?Calcium 8.9 - 10.3 mg/dL 9.6   9.3    ? ? ? ? ? ? ?Carmell Austria, MD 01/05/2022, 8:36 AM ? ?Cc: [place], Rosalyn Charters, MD ? ? ?

## 2022-01-05 NOTE — Op Note (Signed)
[facility] ?Patient Name: Adam Schwartz ?Procedure Date: 01/05/2022 8:31 AM ?MRN: [id number] ?Endoscopist: Jackquline Denmark , MD ?Age: 71 ?Referring MD:  ?Date of Birth: [birth year] ?Gender: Male ?Account #: [id number] ?Procedure:                Colonoscopy ?Indications:              High risk colon cancer surveillance: Personal  ?                          history of colonic polyps ?Medicines:                Monitored Anesthesia Care ?Procedure:                Pre-Anesthesia Assessment: ?                          - Prior to the procedure, a History and Physical  ?                          was performed, and patient medications and  ?                          allergies were reviewed. The patient's tolerance of  ?                          previous anesthesia was also reviewed. The risks  ?                          and benefits of the procedure and the sedation  ?                          options and risks were discussed with the patient.  ?                          All questions were answered, and informed consent  ?                          was obtained. Prior Anticoagulants: The patient has  ?                          taken no previous anticoagulant or antiplatelet  ?                          agents. ASA Grade Assessment: III - A patient with  ?                          severe systemic disease. After reviewing the risks  ?                          and benefits, the patient was deemed in  ?                          satisfactory condition to undergo the procedure. ?  After obtaining informed consent, the colonoscope  ?                          was passed under direct vision. Throughout the  ?                          procedure, the patient's blood pressure, pulse, and  ?                          oxygen saturations were monitored continuously. The  ?                          Olympus CF-[id number] (#[id number]) Colonoscope was  ?                          introduced through the anus and advanced to  the the  ?                          cecum, identified by appendiceal orifice and  ?                          ileocecal valve. The colonoscopy was somewhat  ?                          difficult due to poor bowel prep. Successful  ?                          completion of the procedure was aided by lavage.  ?                          Despite of aggressive suctioning and aspiration, 70  ?                          to 75% of the colonic mucosa was visualized  ?                          satisfactorily. Of note the small and flat lesions  ?                          could have been missed. This is a second attempted  ?                          colonoscopy with a 2-day prep. The patient  ?                          tolerated the procedure well. The quality of the  ?                          bowel preparation was adequate to identify polyps 6  ?                          mm and larger in size. The ileocecal valve,  ?  appendiceal orifice, and rectum were photographed. ?Scope In: 8:41:13 AM ?Scope Out: 9:05:12 AM ?Scope Withdrawal Time: 0 hours 14 minutes 36 seconds  ?Total Procedure Duration: 0 hours 23 minutes 59 seconds  ?Findings:                 Retained stool despite 2-day prep. This is a second  ?                          attempt at colonoscopy. ?                          A 6 mm polyp was found in the mid rectum. The polyp  ?                          was sessile. The polyp was removed with a cold  ?                          snare. Resection and retrieval were complete. ?                          Multiple medium-mouthed diverticula were found in  ?                          the sigmoid colon and descending colon. Few  ?                          diverticula in the ascending colon. ?                          Non-bleeding internal hemorrhoids were found during  ?                          retroflexion. The hemorrhoids were moderate. ?                          The exam was otherwise without abnormality on   ?                          direct and retroflexion views. ?Complications:            No immediate complications. ?Estimated Blood Loss:     Estimated blood loss: none. ?Impression:               - One 6 mm polyp in the mid rectum, removed with a  ?                          cold snare. Resected and retrieved. ?                          - Diverticulosis in the sigmoid colon and in the  ?                          descending colon. ?                          -  Non-bleeding internal hemorrhoids. ?                          - The examination was otherwise normal on direct  ?                          and retroflexion views. ?Recommendation:           - Patient has a contact number available for  ?                          emergencies. The signs and symptoms of potential  ?                          delayed complications were discussed with the  ?                          patient. Return to normal activities tomorrow.  ?                          Written discharge instructions were provided to the  ?                          patient. ?                          - Resume previous diet. ?                          - Continue present medications. ?                          - Await pathology results. ?                          - Repeat colonoscopy in 3 years for surveillance  ?                          with different 2-day prep. Earlier, if with any new  ?                          problems. ?                          - Miralax 1 capful (17 grams) in 8 ounces of water  ?                          PO daily. ?                          - The findings and recommendations were discussed  ?                          with the patient's family. ?Jackquline Denmark, MD ?01/05/2022 9:12:47 AM ?This report has been signed electronically. ?

## 2022-01-05 NOTE — Patient Instructions (Signed)
HANDOUTS PROVIDED ON: POLYPS, DIVERTICULOSIS, & HEMORRHOIDS ? ?The polyp removed today have been sent for pathology.  The results can take 1-3 weeks to receive.  When your next colonoscopy should occur will be based on the pathology results.   ? ?You may resume your previous diet and medication schedule. ? ?Thank you for allowing [place] to care for you today!!! ? ? ?YOU HAD AN ENDOSCOPIC PROCEDURE TODAY AT [facility]:   Refer to the procedure report that was given to you for any specific questions about what was found during the examination.  If the procedure report does not answer your questions, please call your gastroenterologist to clarify.  If you requested that your care partner not be given the details of your procedure findings, then the procedure report has been included in a sealed envelope for you to review at your convenience later. ? ?YOU SHOULD EXPECT: Some feelings of bloating in the abdomen. Passage of more gas than usual.  Walking can help get rid of the air that was put into your GI tract during the procedure and reduce the bloating. If you had a lower endoscopy (such as a colonoscopy or flexible sigmoidoscopy) you may notice spotting of blood in your stool or on the toilet paper. If you underwent a bowel prep for your procedure, you may not have a normal bowel movement for a few days. ? ?Please Note:  You might notice some irritation and congestion in your nose or some drainage.  This is from the oxygen used during your procedure.  There is no need for concern and it should clear up in a day or so. ? ?SYMPTOMS TO REPORT IMMEDIATELY: ? ?Following lower endoscopy (colonoscopy or flexible sigmoidoscopy): ? Excessive amounts of blood in the stool ? Significant tenderness or worsening of abdominal pains ? Swelling of the abdomen that is new, acute ? Fever of 100?F or higher ? ?For urgent or emergent issues, a gastroenterologist can be reached at any hour by calling [phone]. ?Do not  use MyChart messaging for urgent concerns.  ? ? ?DIET:  We do recommend a small meal at first, but then you may proceed to your regular diet.  Drink plenty of fluids but you should avoid alcoholic beverages for 24 hours. ? ?ACTIVITY:  You should plan to take it easy for the rest of today and you should NOT DRIVE or use heavy machinery until tomorrow (because of the sedation medicines used during the test).   ? ?FOLLOW UP: ?Our staff will call the number listed on your records Monday morning between 7:15 am and 8:15 am following your procedure to check on you and address any questions or concerns that you may have regarding the information given to you following your procedure. If we do not reach you, we will leave a message.  We will attempt to reach you two times.  During this call, we will ask if you have developed any symptoms of COVID 19. If you develop any symptoms (ie: fever, flu-like symptoms, shortness of breath, cough etc.) before then, please call [phone].  If you test positive for Covid 19 in the 2 weeks post procedure, please call and report this information to [place].   ? ?If any biopsies were taken you will be contacted by phone or by letter within the next 1-3 weeks.  Please call us at [phone] if you have not heard about the biopsies in 3 weeks.  ? ? ?SIGNATURES/CONFIDENTIALITY: ?You and/or your care partner  have signed paperwork which will be entered into your electronic medical record.  These signatures attest to the fact that that the information above on your After Visit Summary has been reviewed and is understood.  Full responsibility of the confidentiality of this discharge information lies with you and/or your care-partner. ? ?

## 2022-01-05 NOTE — Progress Notes (Signed)
Report to PACU, RN, vss, BBS= Clear.  

## 2022-01-05 NOTE — Progress Notes (Signed)
Pt's states no medical or surgical changes since previsit or office visit.  ° °VS DT °

## 2022-01-09 ENCOUNTER — Telehealth: Payer: Self-pay

## 2022-01-09 ENCOUNTER — Telehealth: Payer: Self-pay | Admitting: *Deleted

## 2022-01-09 NOTE — Telephone Encounter (Signed)
Attempted to call patient for their post-procedure follow-up call. No answer. Left voicemail.   

## 2022-01-09 NOTE — Telephone Encounter (Signed)
?  Follow up Call- ? ? ?  01/05/2022  ?  7:27 AM 11/23/2021  ?  1:47 PM  ?Call back number  ?Post procedure Call Back phone  # [phone]  ?Permission to leave phone message Yes Yes  ?  ? ?Patient questions: ? ?Do you have a fever, pain , or abdominal swelling? No. ?Pain Score  0 * ? ?Have you tolerated food without any problems? Yes.   ? ?Have you been able to return to your normal activities? Yes.   ? ?Do you have any questions about your discharge instructions: ?Diet   No. ?Medications  No. ?Follow up visit  No. ? ?Do you have questions or concerns about your Care? No. ? ?Actions: ?* If pain score is 4 or above: ?No action needed, pain <4. ? ? ? ? ? ?

## 2022-01-15 ENCOUNTER — Encounter: Payer: Self-pay | Admitting: Gastroenterology

## 2023-02-15 ENCOUNTER — Other Ambulatory Visit: Payer: Self-pay | Admitting: Urology

## 2023-02-15 DIAGNOSIS — Z125 Encounter for screening for malignant neoplasm of prostate: Secondary | ICD-10-CM

## 2023-03-31 ENCOUNTER — Ambulatory Visit
Admission: RE | Admit: 2023-03-31 | Discharge: 2023-03-31 | Disposition: A | Discharge status: Home / Self Care | Payer: Medicare Other | Source: Ambulatory Visit | Attending: Urology | Admitting: Urology

## 2023-03-31 DIAGNOSIS — Z125 Encounter for screening for malignant neoplasm of prostate: Secondary | ICD-10-CM

## 2023-04-05 ENCOUNTER — Ambulatory Visit
Admission: RE | Admit: 2023-04-05 | Discharge: 2023-04-05 | Disposition: A | Discharge status: Home / Self Care | Payer: Medicare Other | Source: Ambulatory Visit | Attending: Urology | Admitting: Urology

## 2023-04-05 MED ORDER — GADOPICLENOL 0.5 MMOL/ML IV SOLN
8.0000 mL | Freq: Once | INTRAVENOUS | Status: AC | PRN
  Administered 2023-04-05: 8 mL via INTRAVENOUS

## 2023-11-19 ENCOUNTER — Institutional Professional Consult (permissible substitution) (INDEPENDENT_AMBULATORY_CARE_PROVIDER_SITE_OTHER): Payer: Medicare Other

## 2023-11-22 ENCOUNTER — Telehealth (INDEPENDENT_AMBULATORY_CARE_PROVIDER_SITE_OTHER): Payer: Self-pay | Admitting: Physician Assistant

## 2023-11-22 NOTE — Telephone Encounter (Signed)
Left vm to confirm appt and address for 11/23/2023.

## 2023-11-23 ENCOUNTER — Ambulatory Visit (INDEPENDENT_AMBULATORY_CARE_PROVIDER_SITE_OTHER): Payer: Medicare Other | Admitting: Audiology

## 2023-11-23 ENCOUNTER — Ambulatory Visit (INDEPENDENT_AMBULATORY_CARE_PROVIDER_SITE_OTHER): Payer: PPO | Admitting: Physician Assistant

## 2023-11-23 ENCOUNTER — Institutional Professional Consult (permissible substitution) (INDEPENDENT_AMBULATORY_CARE_PROVIDER_SITE_OTHER): Payer: Medicare Other | Admitting: Audiology

## 2023-11-23 DIAGNOSIS — H905 Unspecified sensorineural hearing loss: Secondary | ICD-10-CM

## 2023-11-23 DIAGNOSIS — H6122 Impacted cerumen, left ear: Secondary | ICD-10-CM | POA: Diagnosis not present

## 2023-11-23 DIAGNOSIS — H903 Sensorineural hearing loss, bilateral: Secondary | ICD-10-CM | POA: Diagnosis not present

## 2023-11-23 NOTE — Progress Notes (Signed)
  [address], [address] [place], [place] [address] [phone]  Audiological Evaluation    Name: Adam Schwartz     DOB:   [birth year]      MRN:   [id number]                                                                                     Service Date: 11/23/2023     Accompanied: yes   Patient comes today after Eyvonne Mechanic, PA-C sent a referral for a hearing evaluation due to concerns with hearing loss. His hearing test was completed after wax was removed from his ear canals.   Symptoms Yes Details  Hearing loss  [x]  Known history of hearing loss in both ears. No previous audiogram on file.  Tinnitus  []    Ear pain/ infections/pressure  []    Balance problems  []    Noise exposure history  [x]  occupational  Previous ear surgeries  []    Family history of hearing loss  [x]  brother  Amplification  [x]  Has a set of receiver in the ear Oticon hearing aids that he reports to have been fit at [facility]  Other  []      Otoscopy: Right ear: Clear external ear canals and notable landmarks visualized on the tympanic membrane. Left ear:  Clear external ear canals and notable landmarks visualized on the tympanic membrane.  Tympanometry: Right ear: Type A- Normal external ear canal volume with normal middle ear pressure and tympanic membrane compliance Left ear: Type A- Normal external ear canal volume with normal middle ear pressure and tympanic membrane compliance    Pure tone Audiometry: Both ears- Normal sloping to profound sensorineural hearing loss from 125 Hz - 8000 Hz.    Speech Audiometry: Right ear- Speech Reception Threshold (SRT) was obtained at 35 dBHL. Left ear-Speech Reception Threshold (SRT) was obtained at 40 dBHL.   Word Recognition Score Tested using NU-6 (MLV) Right ear: 84% was obtained at a presentation level of 85 dBHL with contralateral masking which is deemed as  good . Left ear: 70% was obtained at a presentation level of 85 dBHL with contralateral masking  which is deemed as  fair.   The hearing test results were completed under headphones and re-checked with inserts and results are deemed to be of good reliability. Test technique:  conventional      Recommendations: Follow up with ENT as scheduled for today. Return for a hearing evaluation if concerns with hearing changes arise or per MD recommendation. Of note,a copy of his audiogram was provided for him to take to [facility].   Pamela Maddy MARIE LEROUX-MARTINEZ, AUD

## 2023-11-23 NOTE — Progress Notes (Unsigned)
 Dear Dr. Karna Christmas, Here is my assessment for our mutual patient, Adam Schwartz. Thank you for allowing me the opportunity to care for your patient. Please do not hesitate to contact me should you have any other questions. Sincerely, Burna Forts PA-C  Otolaryngology Clinic Note Referring provider: Dr. Karna Christmas HPI:  Adam Schwartz is a 73 y.o. male kindly referred by Dr. Karna Christmas   The patient presents today for cerumen removal. He notes of hearing loss, this was diagnosed and treated with hearing aids 5 years ago at [facility]. He denies any significant changes in his hearing over the last several years. He was seen   Feels like he has wax in ears  3-4 years ago got cleaned out.   Started using hearing aids 5-6 years ago.   [facility] evaluation, saw wax and sent to [place]. 2 years ago. Saw them on the 4th of February.   Hearing the same, no issues no pain, dizziness. No drainage. No head or neck surgeries, non smoke, occasiona alcohol  H&N Surgery: *** Personal or FHx of bleeding dz or anesthesia difficulty: no ***  GLP-1: *** AP/AC: ***  Tobacco: ***. Alcohol: ***. Occupation: ***. Lives in *** with ***.  Independent Review of Additional Tests or Records:  ***   PMH/Meds/All/SocHx/FamHx/ROS:   Past Medical History:  Diagnosis Date   Diabetes mellitus without complication (HCC)    TYPE 2    Hypertension    Hypothyroidism      Past Surgical History:  Procedure Laterality Date   CARPAL TUNNEL RELEASE Right 03/29/2021   Procedure: RIGHT CARPAL TUNNEL RELEASE;  Surgeon: Cindee Salt, MD;  Location: [facility] OR;  Service: Orthopedics;  Laterality: Right;  60 MIN   COLONOSCOPY  01/05/2022   MOLE REMOVED      PENILE PROSTHESIS IMPLANT     RIGHT BROKEN ANKLE SURGERY      TRIGGER FINGER RELEASE Right 03/29/2021   Procedure: RELEASE TRIGGER FINGER/A-1 PULLEY RIGHT MIDDLE FINGER;  Surgeon: Cindee Salt, MD;  Location: [facility] OR;  Service: Orthopedics;  Laterality: Right;    Family History  Problem  Relation Age of Onset   Anxiety disorder Mother    Heart attack Father 28   Lung cancer Brother    Colon cancer Neg Hx    Colon polyps Neg Hx    Esophageal cancer Neg Hx    Rectal cancer Neg Hx    Stomach cancer Neg Hx      Social Connections: Not on file      Current Outpatient Medications:    acyclovir (ZOVIRAX) 400 MG tablet, Take 200 mg by mouth at bedtime., Disp: , Rfl:    Ascorbic Acid (VITAMIN C) 1000 MG tablet, Take 1,000 mg by mouth daily., Disp: , Rfl:    BELSOMRA 20 MG TABS, Take 1 tablet by mouth at bedtime as needed., Disp: , Rfl:    Cholecalciferol (VITAMIN D3) 50 MCG (2000 UT) TABS, Take 2,000 Units by mouth daily., Disp: , Rfl:    Cyanocobalamin (VITAMIN B 12 PO), Take 1,000 mcg by mouth daily., Disp: , Rfl:    diphenhydrAMINE (BENADRYL) 50 MG tablet, Take 50 mg by mouth at bedtime as needed for itching., Disp: , Rfl:    docusate sodium (COLACE) 50 MG capsule, Take 1 capsule by mouth daily., Disp: , Rfl:    Empagliflozin-metFORMIN HCl (SYNJARDY) 12.02-999 MG TABS, Take 2 tablets by mouth daily., Disp: , Rfl:    levothyroxine (SYNTHROID) 50 MCG tablet, Take 50 mcg by mouth daily before breakfast.,  Disp: , Rfl:    lisinopril (ZESTRIL) 5 MG tablet, Take 5 mg by mouth daily., Disp: , Rfl:    meloxicam (MOBIC) 15 MG tablet, Take 15 mg by mouth daily., Disp: , Rfl:    OZEMPIC, 1 MG/DOSE, 4 MG/3ML SOPN, Inject 1 mg into the skin once a week. Every tuesday, Disp: , Rfl:    pioglitazone (ACTOS) 45 MG tablet, Take 45 mg by mouth daily., Disp: , Rfl:    sennosides-docusate sodium (SENOKOT-S) 8.6-50 MG tablet, Take 1 tablet by mouth daily., Disp: , Rfl:    sildenafil (VIAGRA) 25 MG tablet, 1 tablet as needed, Disp: , Rfl:    simvastatin (ZOCOR) 40 MG tablet, Take 40 mg by mouth at bedtime., Disp: , Rfl:    tiZANidine (ZANAFLEX) 4 MG capsule, Take 4 mg by mouth at bedtime as needed for muscle spasms., Disp: , Rfl:    Physical Exam:   There were no vitals taken for this  visit.  Pertinent Findings  CN II-XII intact *** Bilateral EAC clear and TM intact with well pneumatized middle ear spaces Weber 512: equal Rinne 512: AC > BC b/l  Anterior rhinoscopy: Septum ***; bilateral inferior turbinates with *** No lesions of oral cavity/oropharynx; dentition *** No obviously palpable neck masses/lymphadenopathy/thyromegaly No respiratory distress or stridor  Seprately Identifiable Procedures:  None***  Impression & Plans:  Adam Schwartz is a 73 y.o. male with the following   ***   - f/u ***   Thank you for allowing me the opportunity to care for your patient. Please do not hesitate to contact me should you have any other questions.  Sincerely, Burna Forts PA-C  [facility] Phone: [phone] Fax: [phone]  11/23/2023, 2:34 PM

## 2023-11-27 ENCOUNTER — Encounter (INDEPENDENT_AMBULATORY_CARE_PROVIDER_SITE_OTHER): Payer: Self-pay

## 2024-04-15 ENCOUNTER — Other Ambulatory Visit: Payer: Self-pay | Admitting: Urology

## 2024-04-15 DIAGNOSIS — C61 Malignant neoplasm of prostate: Secondary | ICD-10-CM

## 2024-04-24 ENCOUNTER — Ambulatory Visit
Admission: RE | Admit: 2024-04-24 | Discharge: 2024-04-24 | Disposition: A | Discharge status: Home / Self Care | Source: Ambulatory Visit | Attending: Urology

## 2024-04-24 DIAGNOSIS — C61 Malignant neoplasm of prostate: Secondary | ICD-10-CM

## 2024-04-24 MED ORDER — GADOPICLENOL 0.5 MMOL/ML IV SOLN
9.0000 mL | Freq: Once | INTRAVENOUS | Status: AC | PRN
  Administered 2024-04-24: 9 mL via INTRAVENOUS
